# Patient Record
Sex: Male | Born: 1950 | Race: White | Hispanic: No | Marital: Married | Smoking: Never smoker
Health system: Southern US, Community
[De-identification: ages and names within clinical notes are randomized; demographics above are authoritative.]

## PROBLEM LIST (undated history)

## (undated) DIAGNOSIS — Z9889 Other specified postprocedural states: Secondary | ICD-10-CM

## (undated) DIAGNOSIS — M48061 Spinal stenosis, lumbar region without neurogenic claudication: Secondary | ICD-10-CM

## (undated) DIAGNOSIS — Z973 Presence of spectacles and contact lenses: Secondary | ICD-10-CM

## (undated) DIAGNOSIS — K760 Fatty (change of) liver, not elsewhere classified: Secondary | ICD-10-CM

## (undated) DIAGNOSIS — M1990 Unspecified osteoarthritis, unspecified site: Secondary | ICD-10-CM

## (undated) DIAGNOSIS — I10 Essential (primary) hypertension: Secondary | ICD-10-CM

## (undated) DIAGNOSIS — M21371 Foot drop, right foot: Secondary | ICD-10-CM

## (undated) DIAGNOSIS — E1142 Type 2 diabetes mellitus with diabetic polyneuropathy: Secondary | ICD-10-CM

## (undated) DIAGNOSIS — E119 Type 2 diabetes mellitus without complications: Secondary | ICD-10-CM

## (undated) DIAGNOSIS — C801 Malignant (primary) neoplasm, unspecified: Secondary | ICD-10-CM

## (undated) DIAGNOSIS — E785 Hyperlipidemia, unspecified: Secondary | ICD-10-CM

## (undated) DIAGNOSIS — R112 Nausea with vomiting, unspecified: Secondary | ICD-10-CM

## (undated) DIAGNOSIS — C679 Malignant neoplasm of bladder, unspecified: Secondary | ICD-10-CM

## (undated) HISTORY — PX: CARPAL TUNNEL RELEASE: SHX101

## (undated) HISTORY — DX: Other specified postprocedural states: Z98.890

## (undated) HISTORY — PX: KNEE ARTHROSCOPY: SUR90

## (undated) HISTORY — DX: Other specified postprocedural states: R11.2

## (undated) HISTORY — PX: COLONOSCOPY: SHX174

## (undated) HISTORY — PX: BASAL CELL CARCINOMA EXCISION: SHX1214

---

## 1999-11-30 ENCOUNTER — Emergency Department (HOSPITAL_COMMUNITY): Admission: EM | Admit: 1999-11-30 | Discharge: 1999-12-01 | Payer: Self-pay | Admitting: Emergency Medicine

## 1999-12-01 ENCOUNTER — Encounter: Payer: Self-pay | Admitting: Emergency Medicine

## 2002-02-06 HISTORY — PX: CHOLECYSTECTOMY: SHX55

## 2002-09-30 ENCOUNTER — Encounter: Payer: Self-pay | Admitting: General Surgery

## 2002-09-30 ENCOUNTER — Encounter (INDEPENDENT_AMBULATORY_CARE_PROVIDER_SITE_OTHER): Payer: Self-pay

## 2002-09-30 ENCOUNTER — Observation Stay (HOSPITAL_COMMUNITY): Admission: RE | Admit: 2002-09-30 | Discharge: 2002-10-01 | Payer: Self-pay | Admitting: General Surgery

## 2003-11-26 ENCOUNTER — Ambulatory Visit: Payer: Self-pay | Admitting: Internal Medicine

## 2003-12-17 ENCOUNTER — Ambulatory Visit: Payer: Self-pay | Admitting: Gastroenterology

## 2005-02-06 DIAGNOSIS — C801 Malignant (primary) neoplasm, unspecified: Secondary | ICD-10-CM

## 2005-02-06 HISTORY — DX: Malignant (primary) neoplasm, unspecified: C80.1

## 2005-11-02 ENCOUNTER — Ambulatory Visit: Payer: Self-pay | Admitting: Gastroenterology

## 2005-12-21 ENCOUNTER — Ambulatory Visit: Payer: Self-pay | Admitting: Gastroenterology

## 2007-02-07 HISTORY — PX: HERNIA REPAIR: SHX51

## 2007-03-18 ENCOUNTER — Ambulatory Visit: Payer: Self-pay | Admitting: Internal Medicine

## 2007-05-31 ENCOUNTER — Encounter (INDEPENDENT_AMBULATORY_CARE_PROVIDER_SITE_OTHER): Payer: Self-pay | Admitting: General Surgery

## 2007-05-31 ENCOUNTER — Inpatient Hospital Stay (HOSPITAL_COMMUNITY): Admission: RE | Admit: 2007-05-31 | Discharge: 2007-06-01 | Payer: Self-pay | Admitting: General Surgery

## 2008-06-27 ENCOUNTER — Ambulatory Visit: Payer: Self-pay | Admitting: Unknown Physician Specialty

## 2010-02-06 HISTORY — PX: SHOULDER ARTHROSCOPY: SHX128

## 2010-06-21 NOTE — Op Note (Signed)
NAMEWASHINGTON, [name]           ACCOUNT NO.:  [id number]   MEDICAL RECORD NO.:  [id number]          PATIENT TYPE:  AMB   LOCATION:  DAY                          FACILITY:  [facility]   PHYSICIAN:  Adolph Pollack, M.D.DATE OF BIRTH:  [birth year]   DATE OF PROCEDURE:  05/31/2007  DATE OF DISCHARGE:                               OPERATIVE REPORT   PREOPERATIVE DIAGNOSIS:  Incarcerated incisional hernia in umbilical  area.   POSTOPERATIVE DIAGNOSIS:  Incarcerated incisional hernia in umbilical  area.   PROCEDURE:  Repair of incarcerated incisional hernia.   SURGEON:  Adolph Pollack, M.D.   ANESTHESIA:  General.   INDICATIONS:  This is a 60 year old diabetic male who apparently was  doing some heavy lifting at work and developed some acute pain and  bulging in the umbilical area.  He had laparoscopic cholecystectomy and  a previous incision in the subumbilical region.  The pain persisted, and  he noticed a firm knot that was present.  There was also redness that  began to spread.  He was seen in the urgent office by Dr. Luisa Hart and  felt to have an incarcerated/umbilical hernia/incisional hernia and sent  to [facility] for emergency repair.   TECHNIQUE:  He was seen holding area, brought to the operative room,  placed supine on the operating table, and a general anesthetic was  administered.  Hair in the abdominal wall was clipped, and the area  sterilely prepped and draped.  A curvilinear incision was made inferior  to the umbilicus down to the fascia.  I then encircled the umbilicus and  noted a firm indurated mass in the umbilicus.  I dissected the umbilicus  free from the fascia and noted some incarcerated firm indurated and  inflamed fatty tissue that I debrided from the umbilicus.  There was  also inflamed peritoneum which I debrided as well.  Some of this was  sent for pathology.  There was no purulence.   I then dissected subcutaneous tissue around the fascial  defect which  measured about 1-1/2 to 2 cm.  I then primarily closed the defect with  interrupted #1 Novofil sutures.  I did not use mesh because of the  cellulitis and inflammatory reaction.  I then copiously irrigated the  wound.  Marcaine was infiltrated in the subcutaneous tissue and fascia.   The umbilicus was implanted on the fascia with a 3-0 Vicryl suture.  The  subcutaneous tissue was then closed with a running 3-0 Vicryl suture and  skin closed with staples.  Sterile dressing was applied.   He tolerated the procedure without any apparent complications and was  taken to recovery in satisfactory condition.      Adolph Pollack, M.D.  Electronically Signed    TJR/MEDQ  D:  05/31/2007  T:  05/31/2007  Job:  [id number]

## 2010-06-21 NOTE — Consult Note (Signed)
[name], [name]           ACCOUNT NO.:  [id number]   MEDICAL RECORD NO.:  [id number]          PATIENT TYPE:  AMB   LOCATION:  DAY                          FACILITY:  [facility]   PHYSICIAN:  Thomas A. Cornett, M.D.DATE OF BIRTH:  [birth year]   DATE OF CONSULTATION:  05/31/2007  DATE OF DISCHARGE:                                 CONSULTATION   CHIEF COMPLAINT:  Umbilical pain and redness   HISTORY OF PRESENT ILLNESS:  The patient is a 60 year old male with a 3  day history of increasing abdominal pain.  He has had a bulge at his  umbilicus and this was noted after lifting at work on Tuesday.  The  bulge was tender, but now has associated redness and increasing pain  around the umbilicus.  He has no nausea, vomiting, fever or chills.  His  past medical history is significant for type 2 diabetes mellitus and  hypertension.  He was sent at the request of Dr. Alwyn Ren.  He has no  other complaints.   PAST MEDICAL HISTORY:  1. Hypertension.  2. Type 2 diabetes mellitus.   ALLERGIES:  NONE.   MEDICATIONS:  1. Lisinopril 10 mg daily.  2. Pravastatin 80 mg daily.  3. Metformin, dose not given.  4. Aspirin daily.   PAST SURGICAL HISTORY:  Cholecystectomy.   SOCIAL HISTORY:  He denies tobacco or alcohol use.   FAMILY HISTORY:  Positive for Alzheimer's and type 2 diabetes mellitus.   REVIEW OF SYSTEMS:  Negative x15.   PHYSICAL EXAMINATION:  VITAL SIGNS:  Temperature 97.8, heart rate 83,  blood pressure 113/77.  GENERAL APPEARANCE:  Pleasant male in no apparent distress.  HEENT:  Extraocular movements are intact.  No evidence of scleral  icterus.  NECK:  Supple, nontender.  No mass.  CHEST:  Clear to auscultation.  Chest wall motion normal.  CARDIOVASCULAR:  Regular rate and rhythm without rub, murmur or gallop.  ABDOMEN:  Tender around the umbilicus with a mass at the umbilicus that  is  nonreducible.  Significant erythema around this area.  It is warm to  touch.  EXTREMITIES:  No clubbing, cyanosis nor edema.   IMPRESSION:  Incarcerated umbilical hernia with increased cellulitis.   PLAN:  This will need to be addressed tonight.  I have talked with Dr.  Abbey Chatters who is on call at [facility] and he has agreed to see the  patient.  He will probably need repair of this.  The risk of bleeding,  infection, recurrence are all potential possibilities.      Thomas A. Cornett, M.D.  Electronically Signed     TAC/MEDQ  D:  05/31/2007  T:  05/31/2007  Job:  [id number]   cc:   Alwyn Ren, MD

## 2010-06-24 NOTE — Op Note (Signed)
NAME:  [name], [name]                     ACCOUNT NO.:  [id number]   MEDICAL RECORD NO.:  [id number]                   PATIENT TYPE:  AMB   LOCATION:  DAY                                  FACILITY:  [facility]   PHYSICIAN:  Timothy E. Earlene Plater, M.D.              DATE OF BIRTH:  [birth year]   DATE OF PROCEDURE:  09/30/2002  DATE OF DISCHARGE:                                 OPERATIVE REPORT   PREOPERATIVE DIAGNOSIS:  Cholecystolithiasis.   POSTOPERATIVE DIAGNOSIS:  Cholecystolithiasis.   PROCEDURE:  Laparoscopic cholecystectomy.   SURGEON:  Timothy E. Earlene Plater, M.D.   ASSISTANT:  Anselm Pancoast. Zachery Dakins, M.D.   ANESTHESIA:  CRNA.   INDICATIONS FOR PROCEDURE:  Mr. Lowrimore presented to my office within the  last two weeks following his first acute gallbladder attack. Gallstones are  shown and proven, symptoms are compatible and after careful discussion he  wishes to proceed with surgery. His cardiac status is stable and his  laboratory data are normal. He is identified and a permit signed.   He was taken to the operating room, placed supine, general endotracheal  anesthesia administered. The abdomen was shaved, prepped and draped in the  usual fashion. Marcaine 0.25% with epinephrine was used prior to each  incision. A vertical infraumbilical incision made, the fascia identified,  opened vertically, the abdomen entered without complications. A #1 Vicryl  suture placed across that fascial incision and the Hasson catheter passed  between the suture and the Hasson catheter tied in place. The abdomen was  insufflated. Peritoneoscopy carried out and was unremarkable. A second 10 mm  trocar placed in the mid epigastrium, two 5 mm trocars in the right upper  quadrant. The gallbladder was small, 50% intrahepatic. It was grasped,  placed on tension, careful dissection at the base of the gallbladder  revealed a normal albeit small cystic duct. This was dissected completely, a  window created  posterior to that. A clip was placed on the gallbladder side  of the cystic duct and with the first snip of the scissor, the entire cystic  duct was divided. It was tiny, the lumen was less than a millimeter. It was  picked up and two clips were applied to the cystic duct remnant that were  secure. The artery was identified and triply clipped and divided. A small  bleeder was cauterized, the bed was dry. As noted, the gallbladder was  intrahepatic, there was no tissue to dissected between the gallbladder and  the liver bed and the gallbladder was simply gently carefully peeled out of  the liver bed. Several placed were cauterized and the liver bed was dry. We  did place a piece of Surgicel because of the raw surface. Irrigant was  clear. The gallbladder was removed through the infraumbilical incision which  was then tied under direct vision. Copious irrigation carried out and was  clear. All irrigant, CO2, instruments and trocars removed  under direct vision.  Counts were correct. Each wound was inspected, cautery  was used when necessary. The wounds were closed with 3-0 Monocryl. Steri-  Strips applied, final counts correct. He tolerated it well and was awakened  and taken to the recovery room in good condition.                                               Timothy E. Earlene Plater, M.D.    TED/MEDQ  D:  09/30/2002  T:  09/30/2002  Job:  [id number]

## 2010-11-01 LAB — CBC
HCT: 43.6
Hemoglobin: 14.9
MCHC: 34.1
MCV: 86.3
Platelets: 194
RBC: 5.05
RDW: 12.4
WBC: 6.8

## 2010-11-01 LAB — COMPREHENSIVE METABOLIC PANEL
ALT: 32
AST: 25
Albumin: 4
Alkaline Phosphatase: 66
BUN: 12
CO2: 27
Calcium: 9.2
Chloride: 104
Creatinine, Ser: 0.86
GFR calc Af Amer: >60
GFR calc non Af Amer: >60
Glucose, Bld: 125 — ABNORMAL HIGH
Potassium: 3.8
Sodium: 138
Total Bilirubin: 0.8
Total Protein: 6.8

## 2013-07-20 DIAGNOSIS — E119 Type 2 diabetes mellitus without complications: Secondary | ICD-10-CM | POA: Insufficient documentation

## 2013-07-20 DIAGNOSIS — E785 Hyperlipidemia, unspecified: Secondary | ICD-10-CM | POA: Insufficient documentation

## 2013-07-20 DIAGNOSIS — I10 Essential (primary) hypertension: Secondary | ICD-10-CM | POA: Insufficient documentation

## 2013-07-20 DIAGNOSIS — K760 Fatty (change of) liver, not elsewhere classified: Secondary | ICD-10-CM | POA: Insufficient documentation

## 2013-11-18 ENCOUNTER — Encounter (HOSPITAL_BASED_OUTPATIENT_CLINIC_OR_DEPARTMENT_OTHER)
Admission: RE | Admit: 2013-11-18 | Discharge: 2013-11-18 | Disposition: A | Discharge status: Home / Self Care | Payer: BC Managed Care – PPO | Source: Ambulatory Visit | Attending: Orthopedic Surgery | Admitting: Orthopedic Surgery

## 2013-11-18 ENCOUNTER — Encounter (HOSPITAL_BASED_OUTPATIENT_CLINIC_OR_DEPARTMENT_OTHER): Payer: Self-pay | Admitting: *Deleted

## 2013-11-18 DIAGNOSIS — Y929 Unspecified place or not applicable: Secondary | ICD-10-CM | POA: Diagnosis not present

## 2013-11-18 DIAGNOSIS — X58XXXA Exposure to other specified factors, initial encounter: Secondary | ICD-10-CM | POA: Diagnosis not present

## 2013-11-18 DIAGNOSIS — Z79899 Other long term (current) drug therapy: Secondary | ICD-10-CM | POA: Diagnosis not present

## 2013-11-18 DIAGNOSIS — S86211A Strain of muscle(s) and tendon(s) of anterior muscle group at lower leg level, right leg, initial encounter: Secondary | ICD-10-CM | POA: Diagnosis present

## 2013-11-18 DIAGNOSIS — I10 Essential (primary) hypertension: Secondary | ICD-10-CM | POA: Diagnosis not present

## 2013-11-18 DIAGNOSIS — Z7982 Long term (current) use of aspirin: Secondary | ICD-10-CM | POA: Diagnosis not present

## 2013-11-18 DIAGNOSIS — E1142 Type 2 diabetes mellitus with diabetic polyneuropathy: Secondary | ICD-10-CM | POA: Diagnosis not present

## 2013-11-18 DIAGNOSIS — M62471 Contracture of muscle, right ankle and foot: Secondary | ICD-10-CM | POA: Diagnosis not present

## 2013-11-18 LAB — BASIC METABOLIC PANEL
Anion gap: 12 (ref 5–15)
BUN: 20 mg/dL (ref 6–23)
CALCIUM: 9.5 mg/dL (ref 8.4–10.5)
CO2: 25 meq/L (ref 19–32)
CREATININE: 0.9 mg/dL (ref 0.50–1.35)
Chloride: 103 mEq/L (ref 96–112)
GFR calc Af Amer: >90 mL/min (ref >90)
GFR, EST NON AFRICAN AMERICAN: 89 mL/min — AB (ref >90)
GLUCOSE: 141 mg/dL — AB (ref 70–99)
Potassium: 4.2 mEq/L (ref 3.7–5.3)
Sodium: 140 mEq/L (ref 137–147)

## 2013-11-18 NOTE — Progress Notes (Signed)
11/18/13 1009  [place]  Have you ever been diagnosed with sleep apnea through a sleep study? No  Do you snore loudly (loud enough to be heard through closed doors)?  1  Do you often feel tired, fatigued, or sleepy during the daytime? 0  Has anyone observed you stop breathing during your sleep? 0  Do you have, or are you being treated for high blood pressure? 1  BMI more than 35 kg/m2? 0  Age over 63 years old? 1  Neck circumference greater than 40 cm/16 inches? 0  Gender: 1  Obstructive Sleep Apnea Score 4  Score 4 or greater  Results sent to PCP

## 2013-11-18 NOTE — Progress Notes (Signed)
To come for [id number]

## 2013-11-19 ENCOUNTER — Other Ambulatory Visit: Payer: Self-pay | Admitting: Physician Assistant

## 2013-11-20 ENCOUNTER — Encounter (HOSPITAL_BASED_OUTPATIENT_CLINIC_OR_DEPARTMENT_OTHER)
Admission: RE | Disposition: A | Discharge status: Home / Self Care | Payer: Self-pay | Source: Ambulatory Visit | Attending: Orthopedic Surgery

## 2013-11-20 ENCOUNTER — Ambulatory Visit (HOSPITAL_BASED_OUTPATIENT_CLINIC_OR_DEPARTMENT_OTHER): Payer: BC Managed Care – PPO | Admitting: Certified Registered"

## 2013-11-20 ENCOUNTER — Ambulatory Visit (HOSPITAL_BASED_OUTPATIENT_CLINIC_OR_DEPARTMENT_OTHER)
Admission: RE | Admit: 2013-11-20 | Discharge: 2013-11-20 | Disposition: A | Discharge status: Home / Self Care | Payer: BC Managed Care – PPO | Source: Ambulatory Visit | Attending: Orthopedic Surgery | Admitting: Orthopedic Surgery

## 2013-11-20 ENCOUNTER — Encounter (HOSPITAL_BASED_OUTPATIENT_CLINIC_OR_DEPARTMENT_OTHER): Payer: BC Managed Care – PPO | Admitting: Certified Registered"

## 2013-11-20 ENCOUNTER — Encounter (HOSPITAL_BASED_OUTPATIENT_CLINIC_OR_DEPARTMENT_OTHER): Payer: Self-pay | Admitting: *Deleted

## 2013-11-20 DIAGNOSIS — Z7982 Long term (current) use of aspirin: Secondary | ICD-10-CM | POA: Insufficient documentation

## 2013-11-20 DIAGNOSIS — S86211A Strain of muscle(s) and tendon(s) of anterior muscle group at lower leg level, right leg, initial encounter: Secondary | ICD-10-CM | POA: Insufficient documentation

## 2013-11-20 DIAGNOSIS — X58XXXA Exposure to other specified factors, initial encounter: Secondary | ICD-10-CM | POA: Insufficient documentation

## 2013-11-20 DIAGNOSIS — Z79899 Other long term (current) drug therapy: Secondary | ICD-10-CM | POA: Insufficient documentation

## 2013-11-20 DIAGNOSIS — I10 Essential (primary) hypertension: Secondary | ICD-10-CM | POA: Insufficient documentation

## 2013-11-20 DIAGNOSIS — E1142 Type 2 diabetes mellitus with diabetic polyneuropathy: Secondary | ICD-10-CM | POA: Insufficient documentation

## 2013-11-20 DIAGNOSIS — Y929 Unspecified place or not applicable: Secondary | ICD-10-CM | POA: Insufficient documentation

## 2013-11-20 DIAGNOSIS — S86811D Strain of other muscle(s) and tendon(s) at lower leg level, right leg, subsequent encounter: Secondary | ICD-10-CM

## 2013-11-20 DIAGNOSIS — M62471 Contracture of muscle, right ankle and foot: Secondary | ICD-10-CM | POA: Insufficient documentation

## 2013-11-20 HISTORY — DX: Type 2 diabetes mellitus without complications: E11.9

## 2013-11-20 HISTORY — DX: Unspecified osteoarthritis, unspecified site: M19.90

## 2013-11-20 HISTORY — DX: Presence of spectacles and contact lenses: Z97.3

## 2013-11-20 HISTORY — PX: GASTROC RECESSION EXTREMITY: SHX6262

## 2013-11-20 HISTORY — DX: Essential (primary) hypertension: I10

## 2013-11-20 HISTORY — DX: Type 2 diabetes mellitus with diabetic polyneuropathy: E11.42

## 2013-11-20 HISTORY — PX: TENDON REPAIR: SHX5111

## 2013-11-20 LAB — POCT HEMOGLOBIN-HEMACUE: Hemoglobin: 14.8 g/dL (ref 13.0–17.0)

## 2013-11-20 LAB — GLUCOSE, CAPILLARY
Glucose-Capillary: 138 mg/dL — ABNORMAL HIGH (ref 70–99)
Glucose-Capillary: 148 mg/dL — ABNORMAL HIGH (ref 70–99)

## 2013-11-20 SURGERY — TENDON REPAIR
Anesthesia: General | Site: Leg Lower | Laterality: Right

## 2013-11-20 MED ORDER — BUPIVACAINE-EPINEPHRINE (PF) 0.25% -1:200000 IJ SOLN
INTRAMUSCULAR | Status: DC | PRN
  Administered 2013-11-20: 10 mL

## 2013-11-20 MED ORDER — FENTANYL CITRATE 0.05 MG/ML IJ SOLN
50.0000 ug | INTRAMUSCULAR | Status: DC | PRN
  Administered 2013-11-20: 50 ug via INTRAVENOUS

## 2013-11-20 MED ORDER — BUPIVACAINE HCL (PF) 0.25 % IJ SOLN
INTRAMUSCULAR | Status: AC
  Filled 2013-11-20: qty 30

## 2013-11-20 MED ORDER — MIDAZOLAM HCL 5 MG/5ML IJ SOLN
INTRAMUSCULAR | Status: DC | PRN
  Administered 2013-11-20: 1 mg via INTRAVENOUS

## 2013-11-20 MED ORDER — MIDAZOLAM HCL 2 MG/2ML IJ SOLN
INTRAMUSCULAR | Status: AC
  Filled 2013-11-20: qty 2

## 2013-11-20 MED ORDER — CHLORHEXIDINE GLUCONATE 4 % EX LIQD: 60.0000 mL | Freq: Once | CUTANEOUS | Status: DC

## 2013-11-20 MED ORDER — HYDROMORPHONE HCL 1 MG/ML IJ SOLN
INTRAMUSCULAR | Status: AC
Start: 2013-11-20 — End: 2013-11-20
  Filled 2013-11-20: qty 1

## 2013-11-20 MED ORDER — OXYCODONE HCL 5 MG PO TABS
5.0000 mg | ORAL_TABLET | Freq: Once | ORAL | Status: AC | PRN
  Administered 2013-11-20: 5 mg via ORAL

## 2013-11-20 MED ORDER — ASPIRIN EC 325 MG PO TBEC: 325.0000 mg | DELAYED_RELEASE_TABLET | Freq: Every day | ORAL | Status: DC

## 2013-11-20 MED ORDER — OXYCODONE HCL 5 MG/5ML PO SOLN: 5.0000 mg | Freq: Once | ORAL | Status: AC | PRN

## 2013-11-20 MED ORDER — BACITRACIN ZINC 500 UNIT/GM EX OINT
TOPICAL_OINTMENT | CUTANEOUS | Status: DC | PRN
  Administered 2013-11-20: 1 via TOPICAL

## 2013-11-20 MED ORDER — BACITRACIN ZINC 500 UNIT/GM EX OINT
TOPICAL_OINTMENT | CUTANEOUS | Status: AC
  Filled 2013-11-20: qty 28.35

## 2013-11-20 MED ORDER — PROMETHAZINE HCL 25 MG/ML IJ SOLN: 6.2500 mg | INTRAMUSCULAR | Status: DC | PRN

## 2013-11-20 MED ORDER — LACTATED RINGERS IV SOLN
INTRAVENOUS | Status: DC
  Administered 2013-11-20 (×2): via INTRAVENOUS

## 2013-11-20 MED ORDER — CEFAZOLIN SODIUM-DEXTROSE 2-3 GM-% IV SOLR
2.0000 g | INTRAVENOUS | Status: AC
  Administered 2013-11-20: 2 g via INTRAVENOUS

## 2013-11-20 MED ORDER — FENTANYL CITRATE 0.05 MG/ML IJ SOLN
INTRAMUSCULAR | Status: AC
  Filled 2013-11-20: qty 6

## 2013-11-20 MED ORDER — FENTANYL CITRATE 0.05 MG/ML IJ SOLN
INTRAMUSCULAR | Status: AC
  Filled 2013-11-20: qty 2

## 2013-11-20 MED ORDER — BUPIVACAINE-EPINEPHRINE (PF) 0.25% -1:200000 IJ SOLN
INTRAMUSCULAR | Status: AC
  Filled 2013-11-20: qty 30

## 2013-11-20 MED ORDER — SODIUM CHLORIDE 0.9 % IV SOLN: INTRAVENOUS | Status: DC

## 2013-11-20 MED ORDER — OXYCODONE HCL 5 MG PO TABS
ORAL_TABLET | ORAL | Status: AC
  Filled 2013-11-20: qty 1

## 2013-11-20 MED ORDER — 0.9 % SODIUM CHLORIDE (POUR BTL) OPTIME
TOPICAL | Status: DC | PRN
  Administered 2013-11-20: 1000 mL

## 2013-11-20 MED ORDER — LIDOCAINE HCL (CARDIAC) 20 MG/ML IV SOLN
INTRAVENOUS | Status: DC | PRN
  Administered 2013-11-20: 30 mg via INTRAVENOUS

## 2013-11-20 MED ORDER — ACETAMINOPHEN 500 MG PO TABS
ORAL_TABLET | ORAL | Status: AC
Start: 2013-11-20 — End: 2013-11-20
  Filled 2013-11-20: qty 2

## 2013-11-20 MED ORDER — HYDROMORPHONE HCL 1 MG/ML IJ SOLN
0.2500 mg | INTRAMUSCULAR | Status: DC | PRN
  Administered 2013-11-20 (×2): 0.5 mg via INTRAVENOUS

## 2013-11-20 MED ORDER — ACETAMINOPHEN 500 MG PO TABS
1000.0000 mg | ORAL_TABLET | Freq: Once | ORAL | Status: AC
  Administered 2013-11-20: 1000 mg via ORAL

## 2013-11-20 MED ORDER — DEXAMETHASONE SODIUM PHOSPHATE 10 MG/ML IJ SOLN
INTRAMUSCULAR | Status: DC | PRN
  Administered 2013-11-20: 4 mg via INTRAVENOUS

## 2013-11-20 MED ORDER — OXYCODONE HCL 5 MG PO TABS: 5.0000 mg | ORAL_TABLET | ORAL | Status: DC | PRN

## 2013-11-20 MED ORDER — CEFAZOLIN SODIUM-DEXTROSE 2-3 GM-% IV SOLR
INTRAVENOUS | Status: AC
  Filled 2013-11-20: qty 50

## 2013-11-20 MED ORDER — ROPIVACAINE HCL 5 MG/ML IJ SOLN
INTRAMUSCULAR | Status: DC | PRN
  Administered 2013-11-20: 30 mL via PERINEURAL

## 2013-11-20 MED ORDER — MIDAZOLAM HCL 2 MG/2ML IJ SOLN
1.0000 mg | INTRAMUSCULAR | Status: DC | PRN
  Administered 2013-11-20: 1 mg via INTRAVENOUS

## 2013-11-20 MED ORDER — PROPOFOL 10 MG/ML IV BOLUS
INTRAVENOUS | Status: DC | PRN
  Administered 2013-11-20: 100 mg via INTRAVENOUS
  Administered 2013-11-20: 50 mg via INTRAVENOUS

## 2013-11-20 SURGICAL SUPPLY — 78 items
ANCHOR SCREW TI W/SUT 3 (Screw) ×3 IMPLANT
BANDAGE ESMARK 6X9 LF (GAUZE/BANDAGES/DRESSINGS) ×2 IMPLANT
BLADE AVERAGE 25X9 (BLADE) IMPLANT
BLADE SURG 15 STRL LF DISP TIS (BLADE) ×4 IMPLANT
BLADE SURG 15 STRL SS (BLADE) ×2
BNDG COHESIVE 4X5 TAN STRL (GAUZE/BANDAGES/DRESSINGS) ×3 IMPLANT
BNDG COHESIVE 6X5 TAN STRL LF (GAUZE/BANDAGES/DRESSINGS) ×3 IMPLANT
BNDG ESMARK 4X9 LF (GAUZE/BANDAGES/DRESSINGS) IMPLANT
BNDG ESMARK 6X9 LF (GAUZE/BANDAGES/DRESSINGS) ×3
CHLORAPREP W/TINT 26ML (MISCELLANEOUS) ×3 IMPLANT
COVER BACK TABLE 60X90IN (DRAPES) ×3 IMPLANT
CUFF TOURNIQUET SINGLE 34IN LL (TOURNIQUET CUFF) ×3 IMPLANT
DECANTER SPIKE VIAL GLASS SM (MISCELLANEOUS) IMPLANT
DRAPE C-ARM 42X72 X-RAY (DRAPES) IMPLANT
DRAPE C-ARMOR (DRAPES) IMPLANT
DRAPE EXTREMITY TIBURON (DRAPES) ×3 IMPLANT
DRAPE OEC MINIVIEW 54X84 (DRAPES) ×3 IMPLANT
DRAPE U-SHAPE 47X51 STRL (DRAPES) ×3 IMPLANT
DRSG EMULSION OIL 3X3 NADH (GAUZE/BANDAGES/DRESSINGS) ×3 IMPLANT
DRSG PAD ABDOMINAL 8X10 ST (GAUZE/BANDAGES/DRESSINGS) ×6 IMPLANT
ELECT REM PT RETURN 9FT ADLT (ELECTROSURGICAL) ×3
ELECTRODE REM PT RTRN 9FT ADLT (ELECTROSURGICAL) ×2 IMPLANT
GAUZE SPONGE 4X4 12PLY STRL (GAUZE/BANDAGES/DRESSINGS) ×3 IMPLANT
GLOVE BIO SURGEON STRL SZ7 (GLOVE) IMPLANT
GLOVE BIO SURGEON STRL SZ8 (GLOVE) ×3 IMPLANT
GLOVE BIOGEL M STRL SZ7.5 (GLOVE) ×3 IMPLANT
GLOVE BIOGEL PI IND STRL 7.0 (GLOVE) IMPLANT
GLOVE BIOGEL PI IND STRL 8 (GLOVE) ×4 IMPLANT
GLOVE BIOGEL PI INDICATOR 7.0 (GLOVE)
GLOVE BIOGEL PI INDICATOR 8 (GLOVE) ×2
GLOVE EXAM NITRILE MD LF STRL (GLOVE) ×3 IMPLANT
GOWN STRL REUS W/ TWL LRG LVL3 (GOWN DISPOSABLE) ×4 IMPLANT
GOWN STRL REUS W/ TWL XL LVL3 (GOWN DISPOSABLE) ×4 IMPLANT
GOWN STRL REUS W/TWL LRG LVL3 (GOWN DISPOSABLE) ×2
GOWN STRL REUS W/TWL XL LVL3 (GOWN DISPOSABLE) ×2
K-WIRE .062X4 (WIRE) ×3 IMPLANT
KIT BIO-TENODESIS 3X8 DISP (MISCELLANEOUS)
KIT INSRT BABSR STRL DISP BTN (MISCELLANEOUS) IMPLANT
NDL SUT 6 .5 CRC .975X.05 MAYO (NEEDLE) IMPLANT
NEEDLE HYPO 22GX1.5 SAFETY (NEEDLE) IMPLANT
NEEDLE MAYO TAPER (NEEDLE)
NS IRRIG 1000ML POUR BTL (IV SOLUTION) ×3 IMPLANT
PACK BASIN DAY SURGERY FS (CUSTOM PROCEDURE TRAY) ×3 IMPLANT
PAD CAST 4YDX4 CTTN HI CHSV (CAST SUPPLIES) ×2 IMPLANT
PADDING CAST ABS 4INX4YD NS (CAST SUPPLIES)
PADDING CAST ABS COTTON 4X4 ST (CAST SUPPLIES) IMPLANT
PADDING CAST COTTON 4X4 STRL (CAST SUPPLIES) ×1
PADDING CAST COTTON 6X4 STRL (CAST SUPPLIES) ×3 IMPLANT
PASSER SUT SWANSON 36MM LOOP (INSTRUMENTS) IMPLANT
PENCIL BUTTON HOLSTER BLD 10FT (ELECTRODE) ×3 IMPLANT
SANITIZER HAND PURELL 535ML FO (MISCELLANEOUS) ×3 IMPLANT
SCOTCHCAST PLUS 4X4 WHITE (CAST SUPPLIES) IMPLANT
SHEET MEDIUM DRAPE 40X70 STRL (DRAPES) ×3 IMPLANT
SLEEVE SCD COMPRESS KNEE MED (MISCELLANEOUS) ×3 IMPLANT
SPLINT FAST PLASTER 5X30 (CAST SUPPLIES) ×20
SPLINT PLASTER CAST FAST 5X30 (CAST SUPPLIES) ×40 IMPLANT
SPONGE LAP 18X18 X RAY DECT (DISPOSABLE) ×3 IMPLANT
STOCKINETTE 6  STRL (DRAPES) ×1
STOCKINETTE 6 STRL (DRAPES) ×2 IMPLANT
SUCTION FRAZIER TIP 10 FR DISP (SUCTIONS) ×3 IMPLANT
SUT ETHIBOND 0 MO6 C/R (SUTURE) IMPLANT
SUT ETHIBOND 2 OS 4 DA (SUTURE) IMPLANT
SUT ETHILON 3 0 PS 1 (SUTURE) ×3 IMPLANT
SUT FIBERWIRE 2-0 18 17.9 3/8 (SUTURE)
SUT MERSILENE 2.0 SH NDLE (SUTURE) IMPLANT
SUT MNCRL AB 3-0 PS2 18 (SUTURE) ×6 IMPLANT
SUT MNCRL AB 4-0 PS2 18 (SUTURE) IMPLANT
SUT VIC AB 0 SH 27 (SUTURE) ×18 IMPLANT
SUT VIC AB 2-0 SH 18 (SUTURE) IMPLANT
SUT VIC AB 2-0 SH 27 (SUTURE)
SUT VIC AB 2-0 SH 27XBRD (SUTURE) IMPLANT
SUT VICRYL 4-0 PS2 18IN ABS (SUTURE) IMPLANT
SUTURE FIBERWR 2-0 18 17.9 3/8 (SUTURE) IMPLANT
SYR BULB 3OZ (MISCELLANEOUS) ×3 IMPLANT
TOWEL OR 17X24 6PK STRL BLUE (TOWEL DISPOSABLE) ×6 IMPLANT
TOWEL OR NON WOVEN STRL DISP B (DISPOSABLE) ×3 IMPLANT
TUBE CONNECTING 20X1/4 (TUBING) IMPLANT
UNDERPAD 30X30 INCONTINENT (UNDERPADS AND DIAPERS) ×3 IMPLANT

## 2013-11-20 NOTE — Progress Notes (Signed)
Assisted Dr. Rob Fitzgerald with right, ultrasound guided, popliteal block. Side rails up, monitors on throughout procedure. See vital signs in flow sheet. Tolerated Procedure well. 

## 2013-11-20 NOTE — Discharge Instructions (Signed)
Joseph Simmer, MD [place]  Please read the following information regarding your care after surgery.  Medications  You only need a prescription for the narcotic pain medicine (ex. oxycodone, Percocet, Norco).  All of the other medicines listed below are available over the counter. X acetominophen (Tylenol) 650 mg every 4-6 hours as you need for minor pain X oxycodone as prescribed for moderate to severe pain   Narcotic pain medicine (ex. oxycodone, Percocet, Vicodin) will cause constipation.  To prevent this problem, take the following medicines while you are taking any pain medicine. X docusate sodium (Colace) 100 mg twice a day X senna (Senokot) 2 tablets twice a day  X To help prevent blood clots, take an aspirin (325 mg) once a day for a month after surgery.  You should also get up every hour while you are awake to move around.    Weight Bearing ? Bear weight when you are able on your operated leg or foot. ? Bear weight only on the heel of your operated foot in the post-op shoe. X Do not bear any weight on the operated leg or foot.  Cast / Splint / Dressing X Keep your splint or cast clean and dry.  Dont put anything (coat hanger, pencil, etc) down inside of it.  If it gets damp, use a hair dryer on the cool setting to dry it.  If it gets soaked, call the office to schedule an appointment for a cast change. ? Remove your dressing 3 days after surgery and cover the incisions with dry dressings.    After your dressing, cast or splint is removed; you may shower, but do not soak or scrub the wound.  Allow the water to run over it, and then gently pat it dry.  Swelling It is normal for you to have swelling where you had surgery.  To reduce swelling and pain, keep your toes above your nose for at least 3 days after surgery.  It may be necessary to keep your foot or leg elevated for several weeks.  If it hurts, it should be elevated.  Follow Up Call my office at [phone]  when you are discharged from the hospital or surgery center to schedule an appointment to be seen two weeks after surgery.  Call my office at [phone] if you develop a fever >101.5 F, nausea, vomiting, bleeding from the surgical site or severe pain.     Post Anesthesia Home Care Instructions  Activity: Get plenty of rest for the remainder of the day. A responsible adult should stay with you for 24 hours following the procedure.  For the next 24 hours, DO NOT: -Drive a car -Paediatric nurse -Drink alcoholic beverages -Take any medication unless instructed by your physician -Make any legal decisions or sign important papers.  Meals: Start with liquid foods such as gelatin or soup. Progress to regular foods as tolerated. Avoid greasy, spicy, heavy foods. If nausea and/or vomiting occur, drink only clear liquids until the nausea and/or vomiting subsides. Call your physician if vomiting continues.  Special Instructions/Symptoms: Your throat may feel dry or sore from the anesthesia or the breathing tube placed in your throat during surgery. If this causes discomfort, gargle with warm salt water. The discomfort should disappear within 24 hours.

## 2013-11-20 NOTE — Transfer of Care (Deleted)
Immediate Anesthesia Transfer of Care Note  Patient: [name]  Procedure(s) Performed: Procedure(s): RIGHT TIBIALIS ANTERIOR RECONSTRUCTION GASTROC RESESSION POSSIBLE JONES PROCEDURE POSSIBLE EXTENSOR HALLUCIS LONGUS TRANSFER  (Right)  Patient Location: PACU  Anesthesia Type:GA combined with regional for post-op pain  Level of Consciousness: awake and patient cooperative  Airway & Oxygen Therapy: Patient Spontanous Breathing and Patient connected to face mask oxygen  Post-op Assessment: Report given to PACU RN and Post -op Vital signs reviewed and stable  Post vital signs: Reviewed and stable  Complications: No apparent anesthesia complications

## 2013-11-20 NOTE — Anesthesia Preprocedure Evaluation (Addendum)
Anesthesia Evaluation  Patient identified by MRN, date of birth, ID band Patient awake    Reviewed: Allergy & Precautions, NPO status , Patient's Chart, lab work & pertinent test results  Airway Mallampati: II TM Distance: >3 FB Neck ROM: Full    Dental  (+) Dental Advisory Given   Pulmonary neg pulmonary ROS,  breath sounds clear to auscultation        Cardiovascular hypertension, Pt. on medications Rhythm:Regular Rate:Normal     Neuro/Psych  Neuromuscular disease negative psych ROS   GI/Hepatic negative GI ROS, Neg liver ROS,   Endo/Other  diabetes, Type 2, Oral Hypoglycemic Agents  Renal/GU negative Renal ROS     Musculoskeletal  (+) Arthritis -,   Abdominal   Peds  Hematology negative hematology ROS (+)   Anesthesia Other Findings   Reproductive/Obstetrics                          Anesthesia Physical Anesthesia Plan  ASA: II  Anesthesia Plan: General   Post-op Pain Management:    Induction: Intravenous  Airway Management Planned: LMA and Oral ETT  Additional Equipment:   Intra-op Plan:   Post-operative Plan: Extubation in OR  Informed Consent: I have reviewed the patients History and Physical, chart, labs and discussed the procedure including the risks, benefits and alternatives for the proposed anesthesia with the patient or authorized representative who has indicated his/her understanding and acceptance.   Dental advisory given  Plan Discussed with: CRNA and Surgeon  Anesthesia Plan Comments:         Anesthesia Quick Evaluation

## 2013-11-20 NOTE — Op Note (Signed)
[name], [name]           ACCOUNT NO.:  [id number]  MEDICAL RECORD NO.:  [id number]  LOCATION:                                 FACILITY:  PHYSICIAN:  Wylene Simmer, MD             DATE OF BIRTH:  DATE OF PROCEDURE:  11/20/2013 DATE OF DISCHARGE:                              OPERATIVE REPORT   PREOPERATIVE DIAGNOSES: 1. Right tibialis anterior tendon rupture. 2. Tight right heel cord.  POSTOPERATIVE DIAGNOSES: 1. Right tibialis anterior tendon rupture. 2. Tight right heel cord.  PROCEDURE: 1. Right gastrocnemius recession. 2. Reconstruction of the right tibialis anterior tendon with autograft     plantaris tendon. 3. AP and lateral radiographs of the right foot.  SURGEON:  Wylene Simmer, MD  ANESTHESIA:  General, regional.  ESTIMATED BLOOD LOSS:  Minimal.  TOURNIQUET TIME:  79 minutes, 250 mmHg.  COMPLICATIONS:  None apparent.  DISPOSITION:  Extubated, awake, and stable to recovery.  INDICATIONS FOR PROCEDURE:  The patient is a 63 year old male with past medical history significant for diabetes.  He has felt some pain anteriorly in his ankle for the last couple of months.  Recently, he noticed a knot on the front of the ankle and started having difficulty with his gait.  He was seen in the office and an MRI was ordered.  This showed a rupture of his tibialis anterior tendon.  He presents now for operative treatment of this painful condition.  He understands the risks and benefits of the alternative treatment options, and elects surgical treatment.  He specifically understands risks of bleeding, infection, nerve damage, blood clots, need for additional surgery, continued pain, amputation, and death.  PROCEDURE IN DETAIL:  After preoperative consent was obtained and the correct operative site was identified, the patient was brought to the operating room and placed supine on the operating table.  General anesthesia was induced.  Preoperative antibiotics were  administered. Surgical time-out was taken.  Right lower extremity was prepped and draped in standard sterile fashion with tourniquet around the thigh. The extremity was exsanguinated.  The tourniquet was inflated to 250 mmHg.  A longitudinal incision was made at the medial calf.  Sharp dissection was carried down through the skin and subcutaneous tissue. The superficial fascia was incised.  The plantaris tendon was identified.  A tendon stripper was then used to pass along the tendon and released it proximally and then this was repeated distally.  The tendon graft was then passed to the back table where it was cleared of all muscle and maintained in a moistened sponge.  Sural nerve was protected and the tendon was transected from medial to lateral under direct vision.  The ankle was then noted to dorsiflex 15 degrees with the knee extended.  The wound was irrigated.  The incision was closed with Monocryl and nylon.  Attention was then turned to the ankle where a longitudinal incision was made over the tibialis anterior tendon.  Extensor retinaculum was incised.  The tendon was noted to be ruptured from its insertion.  There was hematoma present.  This was all irrigated.  The tendon end was freshened with scissors and cleaned of all fibrinous  material.  A tagging suture was placed in the tendon end.  It was noted to have good excursion at the muscle belly.  The tendon insertion was inspected.  The tendon was noted to be ruptured distally with intact tibialis anterior tendon over about 2 cm area at the medial cuneiform.  The medial cuneiform was identified on AP and lateral radiographs with a K-wire. The K-wire was removed and a 3 mm Biomet corkscrew anchor was inserted. The periosteum was then debrided from the bone adjacent to the insertion point of the anchor.  The suture from the anchor was advanced into the tendon using a whipstitch retrograde and then antegrade.  Tendon was pulled  down to the bone and the suture tied securely.  The remaining portion of the tendon at the medial cuneiform was then sewn to the tendon at the site of the rupture with figure-of-eight sutures of 0 Vicryl.  The plantaris tendon was then woven through the tibialis anterior tendon and wrapped around the tendon and then through the remaining tendon stump and periosteum distally.  It was sewn in place using 0 Vicryl simple sutures.  The wound was then irrigated copiously. The extensor retinaculum was repaired over the tendon with simple sutures of 0 Vicryl.  Subcutaneous tissue was approximated with 3-0 Monocryl and the skin was closed with a running nylon.  Sterile dressings were applied followed by a well-padded short-leg cast.  The tourniquet was released at 79 minutes after application of the cast. The patient was awakened from anesthesia and transported to the recovery room in stable condition.  FOLLOWUP PLAN:  The patient will be nonweightbearing on the right lower extremity.  He will follow up with me in 2 weeks for conversion to a walking cast.  He will start aspirin for DVT prophylaxis.  RADIOGRAPHS:  AP and lateral radiographs of the right foot were obtained intraoperatively.  These show interval placement of the  metallic suture anchor within the medial cuneiform.  No acute injuries noted.     Wylene Simmer, MD     JH/MEDQ  D:  11/20/2013  T:  11/20/2013  Job:  [id number]

## 2013-11-20 NOTE — H&P (Signed)
[name] is an 63 y.o. male.   Chief Complaint:  Right ankle weakness HPI:  63 y/o male with PMH of diabetes presents now with a right tibialis anterior tendon rupture as well as a tight heelcord.  He presents for operative treatment.    Past Medical History  Diagnosis Date  . Hypertension   . Diabetes mellitus without complication   . Wears glasses   . Diabetic peripheral neuropathy   . Arthritis     Past Surgical History  Procedure Laterality Date  . Colonoscopy    . Cholecystectomy  2004    lap choli  . Shoulder arthroscopy  2012  . Hernia repair  2009    umb    History reviewed. No pertinent family history. Social History:  reports that he has never smoked. He does not have any smokeless tobacco history on file. He reports that he does not drink alcohol or use illicit drugs.  Allergies: No Known Allergies  Medications Prior to Admission  Medication Sig Dispense Refill  . aspirin 81 MG tablet Take 81 mg by mouth daily.      Marland Kitchen glimepiride (AMARYL) 1 MG tablet Take 1 mg by mouth daily with breakfast.      . lisinopril (PRINIVIL,ZESTRIL) 10 MG tablet Take 10 mg by mouth daily.      . meloxicam (MOBIC) 15 MG tablet Take 15 mg by mouth daily.      . metFORMIN (GLUCOPHAGE) 500 MG tablet Take 500 mg by mouth every morning. Takes 3 at same time      . Multiple Vitamins-Minerals (MULTIVITAMIN WITH MINERALS) tablet Take 1 tablet by mouth daily.      . Omega-3 Fatty Acids (FISH OIL) 1000 MG CAPS Take by mouth.      . pravastatin (PRAVACHOL) 80 MG tablet Take 80 mg by mouth daily.        Results for orders placed during the hospital encounter of 11/20/13 (from the past 48 hour(s))  BASIC METABOLIC PANEL     Status: Abnormal   Collection Time    11/18/13  3:30 PM      Result Value Ref Range   Sodium 140  137 - 147 mEq/L   Potassium 4.2  3.7 - 5.3 mEq/L   Chloride 103  96 - 112 mEq/L   CO2 25  19 - 32 mEq/L   Glucose, Bld 141 (*) 70 - 99 mg/dL   BUN 20  6 - 23 mg/dL    Creatinine, Ser 0.90  0.50 - 1.35 mg/dL   Calcium 9.5  8.4 - 10.5 mg/dL   GFR calc non Af Amer 89 (*) >90 mL/min   GFR calc Af Amer >90  >90 mL/min   Comment: (NOTE)     The eGFR has been calculated using the CKD EPI equation.     This calculation has not been validated in all clinical situations.     eGFR's persistently <90 mL/min signify possible Chronic Kidney     Disease.   Anion gap 12  5 - 15  GLUCOSE, CAPILLARY     Status: Abnormal   Collection Time    11/20/13  7:59 AM      Result Value Ref Range   Glucose-Capillary 138 (*) 70 - 99 mg/dL  POCT HEMOGLOBIN-HEMACUE     Status: None   Collection Time    11/20/13  8:01 AM      Result Value Ref Range   Hemoglobin 14.8  13.0 - 17.0 g/dL  No results found.  ROS  No recent f/c/n/v/wt loss  Blood pressure 132/81, pulse 77, temperature 97.8 F (36.6 C), temperature source Oral, resp. rate 17, height _0  (1.778 m), weight 85.73 kg (189 lb), SpO2 100.00%. Physical Exam  wn wd male in nad.  A and O x 4.  Mood and affect normal.  EOMI.  resp unlabored.  R ankle with weakness in DF.  5/5 strength in PF.  Tight heelcord.  No lymphadenopathy.  Palpable pulses.  Feels LT normally at the ankle but diminished sens to LT at the forefoot.  Assessment/Plan R tibialis anterior tendon rupture and tight heelcord - to OR for gastroc recession and tibialis anterior tendon reconstruction.  The risks and benefits of the alternative treatment options have been discussed in detail.  The patient wishes to proceed with surgery and specifically understands risks of bleeding, infection, nerve damage, blood clots, need for additional surgery, amputation and death.   Wylene Simmer 11-25-2013, 8:23 AM

## 2013-11-20 NOTE — Transfer of Care (Signed)
Immediate Anesthesia Transfer of Care Note  Patient: [name]  Procedure(s) Performed: Procedure(s): RIGHT TIBIALIS ANTERIOR RECONSTRUCTION GASTROC RESESSION  (Right)  Patient Location: PACU  Anesthesia Type:GA combined with regional for post-op pain  Level of Consciousness: awake, alert , oriented and patient cooperative  Airway & Oxygen Therapy: Patient Spontanous Breathing and Patient connected to face mask oxygen  Post-op Assessment: Report given to PACU RN and Post -op Vital signs reviewed and stable  Post vital signs: Reviewed and stable  Complications: No apparent anesthesia complications

## 2013-11-20 NOTE — Brief Op Note (Signed)
11/20/2013  10:35 AM  PATIENT:  [name]  63 y.o. male  PRE-OPERATIVE DIAGNOSIS:  RIGHT TIBIALIS ANTERIOR RUPTURE, TIGHT HEEL CORD   POST-OPERATIVE DIAGNOSIS:  RIGHT TIBIALIS ANTERIOR RUPTURE, TIGHT HEEL CORD   Procedure(s): 1.  Reconstruction of right tibialis anterior with plantaris autograft   2.  Right gastrocnemius recession (separate incision)   3.  AP and lateral xray of of the right foot  SURGEON:  Wylene Simmer, MD  ASSISTANT: n/a  ANESTHESIA:   General, regional  EBL:  minimal   TOURNIQUET:   Total Tourniquet Time Documented: Thigh (Right) - 79 minutes Total: Thigh (Right) - 79 minutes   COMPLICATIONS:  None apparent  DISPOSITION:  Extubated, awake and stable to recovery.  DICTATION ID:  [id number]

## 2013-11-20 NOTE — Anesthesia Procedure Notes (Addendum)
Anesthesia Regional Block:  Popliteal block  Pre-Anesthetic Checklist: ,, timeout performed, Correct Patient, Correct Site, Correct Laterality, Correct Procedure, Correct Position, site marked, Risks and benefits discussed,  Surgical consent,  Pre-op evaluation,  At surgeon's request and post-op pain management  Laterality: Right  Prep: chloraprep       Needles:  Injection technique: Single-shot  Needle Type: Echogenic Stimulator Needle     Needle Length: 9cm 9 cm Needle Gauge: 21 and 21 G    Additional Needles:  Procedures: ultrasound guided (picture in chart) and nerve stimulator Popliteal block  Nerve Stimulator or Paresthesia:  Response: tibial, 0.45 mA,  Response: peroneal, 0.5 mA,   Additional Responses:   Narrative:  Start time: 11/20/2013 8:15 AM End time: 11/20/2013 8:25 AM Injection made incrementally with aspirations every 5 mL.  Performed by: Personally  Anesthesiologist: Suzette Battiest, MD  Additional Notes: Risks and benefits explained. Pt tolerated procedure well. No immediate complications noted.   Procedure Name: LMA Insertion Date/Time: 11/20/2013 8:50 AM Performed by: Maicol Bowland Pre-anesthesia Checklist: Patient identified, Emergency Drugs available, Suction available and Patient being monitored Patient Re-evaluated:Patient Re-evaluated prior to inductionOxygen Delivery Method: Circle System Utilized Preoxygenation: Pre-oxygenation with 100% oxygen Intubation Type: IV induction Ventilation: Mask ventilation without difficulty LMA: LMA inserted LMA Size: 4.0 Number of attempts: 1 Airway Equipment and Method: bite block Placement Confirmation: positive ETCO2 Tube secured with: Tape Dental Injury: Teeth and Oropharynx as per pre-operative assessment

## 2013-11-20 NOTE — Anesthesia Postprocedure Evaluation (Signed)
  Anesthesia Post-op Note  Patient: [name]  Procedure(s) Performed: Procedure(s): RIGHT TIBIALIS ANTERIOR RECONSTRUCTION GASTROC RECESSION  (Right)  Patient Location: PACU  Anesthesia Type:GA combined with regional for post-op pain  Level of Consciousness: awake, alert  and oriented  Airway and Oxygen Therapy: Patient Spontanous Breathing  Post-op Pain: mild  Post-op Assessment: Post-op Vital signs reviewed  Post-op Vital Signs: Reviewed  Last Vitals:  Filed Vitals:   11/20/13 1130  BP: 114/72  Pulse: 75  Temp:   Resp: 13    Complications: No apparent anesthesia complications

## 2013-11-25 ENCOUNTER — Encounter (HOSPITAL_BASED_OUTPATIENT_CLINIC_OR_DEPARTMENT_OTHER): Payer: Self-pay | Admitting: Orthopedic Surgery

## 2014-01-28 ENCOUNTER — Encounter: Payer: BC Managed Care – PPO | Admitting: Internal Medicine

## 2014-04-08 ENCOUNTER — Encounter: Payer: Self-pay | Admitting: Internal Medicine

## 2014-05-26 ENCOUNTER — Ambulatory Visit (AMBULATORY_SURGERY_CENTER): Payer: Self-pay

## 2014-05-26 VITALS — Ht 70.0 in | Wt 191.8 lb

## 2014-05-26 DIAGNOSIS — Z1211 Encounter for screening for malignant neoplasm of colon: Secondary | ICD-10-CM

## 2014-05-26 MED ORDER — MOVIPREP 100 G PO SOLR: ORAL | Status: DC

## 2014-05-26 NOTE — Progress Notes (Signed)
Per pt, no allergies to soy or egg products.Pt not taking any weight loss meds or using  O2 at home. 

## 2014-06-05 ENCOUNTER — Encounter: Payer: Self-pay | Admitting: Internal Medicine

## 2014-06-09 ENCOUNTER — Encounter: Payer: Self-pay | Admitting: Internal Medicine

## 2014-06-09 ENCOUNTER — Ambulatory Visit (AMBULATORY_SURGERY_CENTER): Payer: BLUE CROSS/BLUE SHIELD | Admitting: Internal Medicine

## 2014-06-09 VITALS — BP 125/74 | HR 67 | Temp 96.1°F | Resp 20 | Ht 70.0 in | Wt 191.0 lb

## 2014-06-09 DIAGNOSIS — Z1211 Encounter for screening for malignant neoplasm of colon: Secondary | ICD-10-CM | POA: Diagnosis present

## 2014-06-09 DIAGNOSIS — D124 Benign neoplasm of descending colon: Secondary | ICD-10-CM

## 2014-06-09 DIAGNOSIS — K635 Polyp of colon: Secondary | ICD-10-CM | POA: Diagnosis not present

## 2014-06-09 DIAGNOSIS — D125 Benign neoplasm of sigmoid colon: Secondary | ICD-10-CM | POA: Diagnosis not present

## 2014-06-09 LAB — GLUCOSE, CAPILLARY
GLUCOSE-CAPILLARY: 98 mg/dL (ref 70–99)
Glucose-Capillary: 105 mg/dL — ABNORMAL HIGH (ref 70–99)

## 2014-06-09 MED ORDER — SODIUM CHLORIDE 0.9 % IV SOLN: 500.0000 mL | INTRAVENOUS | Status: DC

## 2014-06-09 NOTE — Progress Notes (Signed)
Report to PACU, RN, vss, BBS= Clear.  

## 2014-06-09 NOTE — Patient Instructions (Signed)
YOU HAD AN ENDOSCOPIC PROCEDURE TODAY AT THE [facility]:   Refer to the procedure report that was given to you for any specific questions about what was found during the examination.  If the procedure report does not answer your questions, please call your gastroenterologist to clarify.  If you requested that your care partner not be given the details of your procedure findings, then the procedure report has been included in a sealed envelope for you to review at your convenience later.  YOU SHOULD EXPECT: Some feelings of bloating in the abdomen. Passage of more gas than usual.  Walking can help get rid of the air that was put into your GI tract during the procedure and reduce the bloating. If you had a lower endoscopy (such as a colonoscopy or flexible sigmoidoscopy) you may notice spotting of blood in your stool or on the toilet paper. If you underwent a bowel prep for your procedure, you may not have a normal bowel movement for a few days.  Please Note:  You might notice some irritation and congestion in your nose or some drainage.  This is from the oxygen used during your procedure.  There is no need for concern and it should clear up in a day or so.  SYMPTOMS TO REPORT IMMEDIATELY:   Following lower endoscopy (colonoscopy or flexible sigmoidoscopy):  Excessive amounts of blood in the stool  Significant tenderness or worsening of abdominal pains  Swelling of the abdomen that is new, acute  Fever of 100F or higher    For urgent or emergent issues, a gastroenterologist can be reached at any hour by calling [phone].   DIET: Your first meal following the procedure should be a small meal and then it is ok to progress to your normal diet. Heavy or fried foods are harder to digest and may make you feel nauseous or bloated.  Likewise, meals heavy in dairy and vegetables can increase bloating.  Drink plenty of fluids but you should avoid alcoholic beverages for 24  hours.  ACTIVITY:  You should plan to take it easy for the rest of today and you should NOT DRIVE or use heavy machinery until tomorrow (because of the sedation medicines used during the test).    FOLLOW UP: Our staff will call the number listed on your records the next business day following your procedure to check on you and address any questions or concerns that you may have regarding the information given to you following your procedure. If we do not reach you, we will leave a message.  However, if you are feeling well and you are not experiencing any problems, there is no need to return our call.  We will assume that you have returned to your regular daily activities without incident.  If any biopsies were taken you will be contacted by phone or by letter within the next 1-3 weeks.  Please call us at [phone] if you have not heard about the biopsies in 3 weeks.    SIGNATURES/CONFIDENTIALITY: You and/or your care partner have signed paperwork which will be entered into your electronic medical record.  These signatures attest to the fact that that the information above on your After Visit Summary has been reviewed and is understood.  Full responsibility of the confidentiality of this discharge information lies with you and/or your care-partner.   Resume medications. Information given on polyps, diverticulosis and high fiber diet with discharge instructions. 

## 2014-06-09 NOTE — Op Note (Signed)
[place]  [address] [place], [id number]   COLONOSCOPY PROCEDURE REPORT  PATIENT: [name], [name]  MR#: [id number] BIRTHDATE: 06/17/1950 , 63  yrs. old GENDER: male ENDOSCOPIST: Eustace Quail, MD REFERRED BY:.Direct Self PROCEDURE DATE:  06/09/2014 PROCEDURE:   Colonoscopy, screening and Colonoscopy with snare polypectomy x 3 First Screening Colonoscopy - Avg.  risk and is 50 yrs.  old or older Yes.  Prior Negative Screening - Now for repeat screening. N/A  History of Adenoma - Now for follow-up colonoscopy & has been > or = to 3 yrs.  N/A ASA CLASS:   Class II INDICATIONS:Screening for colonic neoplasia and Colorectal Neoplasm Risk Assessment for this procedure is average risk.   Reports negative index exam at [facility] in 2005 MEDICATIONS: Monitored anesthesia care and Propofol 200 mg IV  DESCRIPTION OF PROCEDURE:   After the risks benefits and alternatives of the procedure were thoroughly explained, informed consent was obtained.  The digital rectal exam revealed no abnormalities of the rectum.   The LB [id number]  endoscope was introduced through the anus and advanced to the cecum, which was identified by both the appendix and ileocecal valve. No adverse events experienced.   The quality of the prep was excellent. (MoviPrep was used)  The instrument was then slowly withdrawn as the colon was fully examined.   COLON FINDINGS: Three polyps ranging between 3-38mm in size were found in the sigmoid colon and descending colon.  A polypectomy was performed with a cold snare.  The resection was complete, the polyp tissue was completely retrieved and sent to histology.   There was moderate diverticulosis noted in the left colon.   The examination was otherwise normal.  Retroflexed views revealed internal hemorrhoids. The time to cecum = 2.5 Withdrawal time = 14.6   The scope was withdrawn and the procedure completed. COMPLICATIONS: There were no  immediate complications.  ENDOSCOPIC IMPRESSION: 1.   Three polyps were found in the sigmoid colon and descending colon; polypectomy was performed with a cold snare 2.   Moderate diverticulosis was noted in the left colon 3.   The examination was otherwise normal  RECOMMENDATIONS: 1. Follow up colonoscopy in 5 years  eSigned:  Eustace Quail, MD 06/09/2014 9:21 AM   cc: The Patient and Frazier Richards, MD

## 2014-06-09 NOTE — Progress Notes (Signed)
Called to room to assist during endoscopic procedure.  Patient ID and intended procedure confirmed with present staff. Received instructions for my participation in the procedure from the performing physician.  

## 2014-06-10 ENCOUNTER — Telehealth: Payer: Self-pay | Admitting: *Deleted

## 2014-06-10 NOTE — Telephone Encounter (Signed)
  Follow up Call-  Call back number 06/09/2014  Post procedure Call Back phone  # [phone]  Permission to leave phone message Yes     Patient questions:  Do you have a fever, pain , or abdominal swelling? No. Pain Score  0 *  Have you tolerated food without any problems? Yes.    Have you been able to return to your normal activities? Yes.    Do you have any questions about your discharge instructions: Diet   No. Medications  No. Follow up visit  No.  Do you have questions or concerns about your Care? No.  Actions: * If pain score is 4 or above: No action needed, pain <4.

## 2014-06-16 ENCOUNTER — Encounter: Payer: Self-pay | Admitting: Internal Medicine

## 2015-01-18 DIAGNOSIS — M6530 Trigger finger, unspecified finger: Secondary | ICD-10-CM | POA: Insufficient documentation

## 2015-06-16 DIAGNOSIS — Z0000 Encounter for general adult medical examination without abnormal findings: Secondary | ICD-10-CM | POA: Insufficient documentation

## 2016-10-03 ENCOUNTER — Other Ambulatory Visit: Payer: Self-pay | Admitting: Urology

## 2016-10-03 DIAGNOSIS — R310 Gross hematuria: Secondary | ICD-10-CM

## 2016-10-04 ENCOUNTER — Other Ambulatory Visit: Payer: Self-pay | Admitting: Urology

## 2016-10-04 DIAGNOSIS — R310 Gross hematuria: Secondary | ICD-10-CM

## 2016-10-12 ENCOUNTER — Ambulatory Visit: Payer: BLUE CROSS/BLUE SHIELD

## 2016-10-20 NOTE — H&P (Signed)
[name], [name]           ACCOUNT NO.:  [id number]  MEDICAL RECORD NO.:  [id number]  LOCATION:                                 FACILITY:  PHYSICIAN:  [name]          DATE OF BIRTH:  [birth year]  DATE OF ADMISSION: DATE OF DISCHARGE:                            HISTORY AND PHYSICAL   SAME-DAY SURGERY:  October 31, 2016.  CHIEF COMPLAINT:  Bloody urine.  HISTORY OF PRESENT ILLNESS:  Mr. [name] is a 66 year old white male who presented to the office with a 2-1/2 month history of gross painless hematuria.  Evaluation in the office included a CT scan, which indicated normal kidneys.  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  The patient comes in now for transurethral resection of bladder tumor.  PAST MEDICAL HISTORY:  No drug allergies.  MEDICATIONS:  Include lisinopril, glimepiride, metformin ER, pravastatin, aspirin, multivitamins, fish oil, and cinnamon.  SURGICAL HISTORY: 1. Cholecystectomy, 2004. 2. Left knee surgery, 2005. 3. Umbilical herniorrhaphy, 2009. 4. Right shoulder surgery in 2012. 5. Bilateral carpal tunnel repair, 2015.  SOCIAL HISTORY:  The patient denied tobacco or alcohol use.  FAMILY HISTORY:  The patient's parents have diabetes, hypertension, and arthritis.  Father died at age 27.  Mother died at age 33.  PAST AND CURRENT MEDICAL CONDITIONS: 1. Hypertension. 2. Hyperlipidemia. 3. Diabetes. 4. Erectile dysfunction.  REVIEW OF SYSTEMS:  The patient has decreased auditory and visual acuity.  He denied weight change, fatigue, fever, chills, night sweats, visual disturbances other than decreased acuity, leg pain, shortness of breath, cough, nausea, vomiting, diarrhea, constipation, painful voiding, difficulty voiding, hematochezia, and numbness in Joseph arms and legs.  PHYSICAL EXAMINATION:  VITAL SIGNS:  Height 5 feet 9 inches, weight 188, BMI  28. GENERAL:  Well-nourished white male, in no acute distress. HEENT:  Sclerae were clear.  Pupils were equally round, reactive to light and accommodation.  Extraocular movements were intact. NECK:  Supple.  No audible carotid bruits.  No palpable cervical masses. LUNGS:  Clear to auscultation. CARDIOVASCULAR:  Regular rhythm and rate without audible murmurs. ABDOMEN:  Soft, nontender abdomen. GU:  Uncircumcised.  Testes smooth and nontender, 18 cc in size each. RECTAL:  50 g, smooth, nontender prostate. NEUROMUSCULAR:  Alert and oriented x3.  IMPRESSION: 1. Bladder tumor. 2. Gross hematuria secondary to bladder tumor. 3. Benign prostatic hypertrophy. 4. Erectile dysfunction.  PLAN:  Transurethral resection of bladder tumor.    ______________________________ [name]   ______________________________ [name]    MW/MEDQ  D:  10/19/2016  T:  10/19/2016  Job:  [id number]

## 2016-10-24 ENCOUNTER — Encounter
Admission: RE | Admit: 2016-10-24 | Discharge: 2016-10-24 | Disposition: A | Discharge status: Home / Self Care | Payer: BLUE CROSS/BLUE SHIELD | Source: Ambulatory Visit | Attending: Physical Medicine and Rehabilitation | Admitting: Physical Medicine and Rehabilitation

## 2016-10-24 NOTE — Patient Instructions (Signed)
  Your procedure is scheduled on: Tues 10/31/16 Report to Day Surgery. To find out your arrival time please call [phone] between 1PM - 3PM on Mon 10/30/16.  Remember: Instructions that are not followed completely may result in serious medical risk, up to and including death, or upon the discretion of your surgeon and anesthesiologist your surgery may need to be rescheduled.    _x___ 1. Do not eat food after midnight the night before your procedure. No gum chewing or hard candies. You may drink clear liquids up to 2 hours before you are scheduled to arrive for your surgery- DO not drink clear liquids within 2 hours of the start of your surgery.  Clear Liquids include: water, apple juice without pulp, clear carbohydrate drink such as Clearfast of Gartorade, Black Coffee or Tea (Do not add anything to coffee or tea).    _x___ 2. No Alcohol for 24 hours before or after surgery.   ____ 3. Do Not Smoke For 24 Hours Prior to Your Surgery.   ____ 4. Bring all medications with you on the day of surgery if instructed.    x____ 5. Notify your doctor if there is any change in your medical condition     (cold, fever, infections).       Do not wear jewelry, make-up, hairpins, clips or nail polish.  Do not wear lotions, powders, or perfumes. You may wear deodorant.  Do not shave 48 hours prior to surgery. Men may shave face and neck.  Do not bring valuables to the hospital.    [facility] is not responsible for any belongings or valuables.               Contacts, dentures or bridgework may not be worn into surgery.  Leave your suitcase in the car. After surgery it may be brought to your room.  For patients admitted to the hospital, discharge time is determined by your                treatment team.   Patients discharged the day of surgery will not be allowed to drive home.   Please read over the following fact sheets that you were given:      __x__ Take these medicines the  morning of surgery with A SIP OF WATER:    1. acetaminophen (TYLENOL) 650 MG CR tablet if needed  2.   3.   4.  5.  6.  ____ Fleet Enema (as directed)   ____ Use CHG Soap as directed  ____ Use inhalers on the day of surgery  _x___ Stop metformin 2 days prior to surgery 10/28/16    ____ Take 1/2 of usual insulin dose the night before surgery and none on the morning of surgery.   __x__ Stop aspirin on  today  _x___ Stop Anti-inflammatories Advil, ibuprofen, aleve.  Tylenol only   __x__ Stop supplements until after surgery.  today  ____ Bring C-Pap to the hospital.

## 2016-10-27 ENCOUNTER — Encounter
Admission: RE | Admit: 2016-10-27 | Discharge: 2016-10-27 | Disposition: A | Discharge status: Home / Self Care | Payer: BLUE CROSS/BLUE SHIELD | Source: Ambulatory Visit | Attending: Urology | Admitting: Urology

## 2016-10-27 DIAGNOSIS — I10 Essential (primary) hypertension: Secondary | ICD-10-CM | POA: Insufficient documentation

## 2016-10-31 ENCOUNTER — Encounter
Admission: RE | Disposition: A | Discharge status: Home / Self Care | Payer: Self-pay | Source: Ambulatory Visit | Attending: Urology

## 2016-10-31 ENCOUNTER — Ambulatory Visit: Payer: BLUE CROSS/BLUE SHIELD | Admitting: Anesthesiology

## 2016-10-31 ENCOUNTER — Ambulatory Visit
Admission: RE | Admit: 2016-10-31 | Discharge: 2016-10-31 | Disposition: A | Discharge status: Home / Self Care | Payer: BLUE CROSS/BLUE SHIELD | Source: Ambulatory Visit | Attending: Urology | Admitting: Urology

## 2016-10-31 ENCOUNTER — Encounter: Payer: Self-pay | Admitting: *Deleted

## 2016-10-31 DIAGNOSIS — C674 Malignant neoplasm of posterior wall of bladder: Secondary | ICD-10-CM | POA: Diagnosis present

## 2016-10-31 DIAGNOSIS — R310 Gross hematuria: Secondary | ICD-10-CM | POA: Diagnosis not present

## 2016-10-31 DIAGNOSIS — I10 Essential (primary) hypertension: Secondary | ICD-10-CM | POA: Diagnosis not present

## 2016-10-31 DIAGNOSIS — Z7982 Long term (current) use of aspirin: Secondary | ICD-10-CM | POA: Diagnosis not present

## 2016-10-31 DIAGNOSIS — E119 Type 2 diabetes mellitus without complications: Secondary | ICD-10-CM | POA: Diagnosis not present

## 2016-10-31 DIAGNOSIS — E785 Hyperlipidemia, unspecified: Secondary | ICD-10-CM | POA: Diagnosis not present

## 2016-10-31 DIAGNOSIS — Z79899 Other long term (current) drug therapy: Secondary | ICD-10-CM | POA: Insufficient documentation

## 2016-10-31 DIAGNOSIS — Z7984 Long term (current) use of oral hypoglycemic drugs: Secondary | ICD-10-CM | POA: Diagnosis not present

## 2016-10-31 DIAGNOSIS — N400 Enlarged prostate without lower urinary tract symptoms: Secondary | ICD-10-CM | POA: Insufficient documentation

## 2016-10-31 DIAGNOSIS — N529 Male erectile dysfunction, unspecified: Secondary | ICD-10-CM | POA: Insufficient documentation

## 2016-10-31 HISTORY — PX: TRANSURETHRAL RESECTION OF BLADDER TUMOR: SHX2575

## 2016-10-31 HISTORY — DX: Malignant (primary) neoplasm, unspecified: C80.1

## 2016-10-31 LAB — GLUCOSE, CAPILLARY
GLUCOSE-CAPILLARY: 103 mg/dL — AB (ref 65–99)
Glucose-Capillary: 105 mg/dL — ABNORMAL HIGH (ref 65–99)

## 2016-10-31 SURGERY — TURBT (TRANSURETHRAL RESECTION OF BLADDER TUMOR)
Anesthesia: General | Site: Bladder | Wound class: Clean Contaminated

## 2016-10-31 MED ORDER — PROPOFOL 10 MG/ML IV BOLUS
INTRAVENOUS | Status: DC | PRN
  Administered 2016-10-31: 170 mg via INTRAVENOUS

## 2016-10-31 MED ORDER — MIDAZOLAM HCL 2 MG/2ML IJ SOLN
INTRAMUSCULAR | Status: AC
  Filled 2016-10-31: qty 2

## 2016-10-31 MED ORDER — DEXAMETHASONE SODIUM PHOSPHATE 10 MG/ML IJ SOLN
INTRAMUSCULAR | Status: AC
  Filled 2016-10-31: qty 1

## 2016-10-31 MED ORDER — MITOMYCIN CHEMO FOR BLADDER INSTILLATION 40 MG
40.0000 mg | Freq: Once | INTRAVENOUS | Status: DC
  Filled 2016-10-31: qty 40

## 2016-10-31 MED ORDER — LIDOCAINE HCL 2 % EX GEL
CUTANEOUS | Status: AC
  Filled 2016-10-31: qty 10

## 2016-10-31 MED ORDER — TAMSULOSIN HCL 0.4 MG PO CAPS: 0.4000 mg | ORAL_CAPSULE | Freq: Every day | ORAL | 11 refills | Status: DC

## 2016-10-31 MED ORDER — FAMOTIDINE 20 MG PO TABS
20.0000 mg | ORAL_TABLET | Freq: Once | ORAL | Status: AC
  Administered 2016-10-31: 20 mg via ORAL

## 2016-10-31 MED ORDER — EPHEDRINE SULFATE 50 MG/ML IJ SOLN
INTRAMUSCULAR | Status: DC | PRN
  Administered 2016-10-31: 10 mg via INTRAVENOUS

## 2016-10-31 MED ORDER — SODIUM CHLORIDE 0.9 % IV SOLN: INTRAVENOUS | Status: DC

## 2016-10-31 MED ORDER — FENTANYL CITRATE (PF) 100 MCG/2ML IJ SOLN: 25.0000 ug | INTRAMUSCULAR | Status: DC | PRN

## 2016-10-31 MED ORDER — CIPROFLOXACIN HCL 500 MG PO TABS: 500.0000 mg | ORAL_TABLET | Freq: Two times a day (BID) | ORAL | 0 refills | Status: DC

## 2016-10-31 MED ORDER — FENTANYL CITRATE (PF) 100 MCG/2ML IJ SOLN
INTRAMUSCULAR | Status: AC
  Filled 2016-10-31: qty 2

## 2016-10-31 MED ORDER — ONDANSETRON HCL 4 MG/2ML IJ SOLN
INTRAMUSCULAR | Status: DC | PRN
  Administered 2016-10-31: 4 mg via INTRAVENOUS

## 2016-10-31 MED ORDER — DEXAMETHASONE SODIUM PHOSPHATE 10 MG/ML IJ SOLN
INTRAMUSCULAR | Status: DC | PRN
  Administered 2016-10-31: 10 mg via INTRAVENOUS

## 2016-10-31 MED ORDER — ONDANSETRON HCL 4 MG/2ML IJ SOLN
INTRAMUSCULAR | Status: AC
  Filled 2016-10-31: qty 2

## 2016-10-31 MED ORDER — LEVOFLOXACIN IN D5W 500 MG/100ML IV SOLN
INTRAVENOUS | Status: AC
  Filled 2016-10-31: qty 100

## 2016-10-31 MED ORDER — LEVOFLOXACIN IN D5W 500 MG/100ML IV SOLN
500.0000 mg | Freq: Once | INTRAVENOUS | Status: AC
  Administered 2016-10-31: 500 mg via INTRAVENOUS

## 2016-10-31 MED ORDER — URIBEL 118 MG PO CAPS: 1.0000 | ORAL_CAPSULE | Freq: Four times a day (QID) | ORAL | 3 refills | Status: DC | PRN

## 2016-10-31 MED ORDER — FAMOTIDINE 20 MG PO TABS
ORAL_TABLET | ORAL | Status: AC
  Administered 2016-10-31: 20 mg via ORAL
  Filled 2016-10-31: qty 1

## 2016-10-31 MED ORDER — MIDAZOLAM HCL 2 MG/2ML IJ SOLN
INTRAMUSCULAR | Status: DC | PRN
  Administered 2016-10-31: 2 mg via INTRAVENOUS

## 2016-10-31 MED ORDER — FENTANYL CITRATE (PF) 100 MCG/2ML IJ SOLN
INTRAMUSCULAR | Status: DC | PRN
  Administered 2016-10-31: 50 ug via INTRAVENOUS

## 2016-10-31 MED ORDER — ONDANSETRON HCL 4 MG/2ML IJ SOLN: 4.0000 mg | Freq: Once | INTRAMUSCULAR | Status: DC | PRN

## 2016-10-31 MED ORDER — MITOMYCIN CHEMO FOR BLADDER INSTILLATION 40 MG
INTRAVENOUS | Status: DC | PRN
  Administered 2016-10-31: 40 mg via INTRAVESICAL

## 2016-10-31 MED ORDER — PROPOFOL 10 MG/ML IV BOLUS
INTRAVENOUS | Status: AC
  Filled 2016-10-31: qty 20

## 2016-10-31 MED ORDER — LACTATED RINGERS IV SOLN
INTRAVENOUS | Status: DC | PRN
  Administered 2016-10-31: 14:00:00 via INTRAVENOUS

## 2016-10-31 MED ORDER — LIDOCAINE HCL 2 % EX GEL
CUTANEOUS | Status: DC | PRN
  Administered 2016-10-31: 1

## 2016-10-31 SURGICAL SUPPLY — 20 items
BAG DRAIN CYSTO-URO LG1000N (MISCELLANEOUS) ×2 IMPLANT
BAG URO DRAIN 2000ML W/SPOUT (MISCELLANEOUS) ×2 IMPLANT
CATH FOLEY 2WAY  5CC 20FR SIL (CATHETERS) ×1
CATH FOLEY 2WAY 5CC 20FR SIL (CATHETERS) ×1 IMPLANT
DRSG TELFA 4X3 1S NADH ST (GAUZE/BANDAGES/DRESSINGS) ×2 IMPLANT
ELECT LOOP 22F BIPOLAR SML (ELECTROSURGICAL)
ELECTRODE LOOP 22F BIPOLAR SML (ELECTROSURGICAL) IMPLANT
GLOVE BIO SURGEON STRL SZ7.5 (GLOVE) ×4 IMPLANT
GOWN STRL REUS W/ TWL LRG LVL4 (GOWN DISPOSABLE) ×1 IMPLANT
GOWN STRL REUS W/ TWL XL LVL3 (GOWN DISPOSABLE) ×1 IMPLANT
GOWN STRL REUS W/TWL LRG LVL4 (GOWN DISPOSABLE) ×1
GOWN STRL REUS W/TWL XL LVL3 (GOWN DISPOSABLE) ×1
KIT RM TURNOVER CYSTO AR (KITS) ×2 IMPLANT
LOOP CUT BIPOLAR 24F LRG (ELECTROSURGICAL) ×2 IMPLANT
PACK CYSTO AR (MISCELLANEOUS) ×2 IMPLANT
SET IRRIG Y TYPE TUR BLADDER L (SET/KITS/TRAYS/PACK) ×4 IMPLANT
SOL .9 NS 3000ML IRR  AL (IV SOLUTION) ×4
SOL .9 NS 3000ML IRR UROMATIC (IV SOLUTION) ×4 IMPLANT
SYRINGE IRR TOOMEY STRL 70CC (SYRINGE) ×2 IMPLANT
WATER STERILE IRR 1000ML POUR (IV SOLUTION) ×2 IMPLANT

## 2016-10-31 NOTE — Anesthesia Preprocedure Evaluation (Signed)
Anesthesia Evaluation  Patient identified by MRN, date of birth, ID band Patient awake    Reviewed: Allergy & Precautions, NPO status , Patient's Chart, lab work & pertinent test results  History of Anesthesia Complications (+) PONV and history of anesthetic complications  Airway Mallampati: II       Dental   Pulmonary neg sleep apnea, neg COPD,           Cardiovascular hypertension, Pt. on medications (-) Past MI and (-) CHF (-) dysrhythmias (-) Valvular Problems/Murmurs     Neuro/Psych neg Seizures    GI/Hepatic Neg liver ROS, neg GERD  ,  Endo/Other  diabetes, Type 2, Oral Hypoglycemic Agents  Renal/GU negative Renal ROS     Musculoskeletal   Abdominal   Peds  Hematology   Anesthesia Other Findings   Reproductive/Obstetrics                             Anesthesia Physical Anesthesia Plan  ASA: II  Anesthesia Plan: General   Post-op Pain Management:    Induction: Intravenous  PONV Risk Score and Plan: 3 and Ondansetron, Dexamethasone, Midazolam and Treatment may vary due to age or medical condition  Airway Management Planned: LMA  Additional Equipment:   Intra-op Plan:   Post-operative Plan:   Informed Consent: I have reviewed the patients History and Physical, chart, labs and discussed the procedure including the risks, benefits and alternatives for the proposed anesthesia with the patient or authorized representative who has indicated his/her understanding and acceptance.     Plan Discussed with:   Anesthesia Plan Comments:         Anesthesia Quick Evaluation

## 2016-10-31 NOTE — Transfer of Care (Signed)
Immediate Anesthesia Transfer of Care Note  Patient: HAKEEN SHIPES  Procedure(s) Performed: Procedure(s): TRANSURETHRAL RESECTION OF BLADDER TUMOR (TURBT) (N/A)  Patient Location: PACU  Anesthesia Type:General  Level of Consciousness: sedated  Airway & Oxygen Therapy: Patient Spontanous Breathing and Patient connected to face mask oxygen  Post-op Assessment: Report given to RN and Post -op Vital signs reviewed and stable  Post vital signs: Reviewed and stable  Last Vitals:  Vitals:   10/31/16 1159 10/31/16 1506  BP: (!) 143/85 (!) 141/87  Pulse: 66 69  Resp: 16 15  Temp: (!) 35.7 C (!) 36.3 C  SpO2: 100% 100%    Last Pain:  Vitals:   10/31/16 1159  TempSrc: Tympanic  PainSc: 0-No pain      Patients Stated Pain Goal: 0 (40/81/44 8185)  Complications: No apparent anesthesia complications

## 2016-10-31 NOTE — Anesthesia Post-op Follow-up Note (Signed)
Anesthesia QCDR form completed.        

## 2016-10-31 NOTE — Anesthesia Postprocedure Evaluation (Signed)
Anesthesia Post Note  Patient: [name]  Procedure(s) Performed: Procedure(s) (LRB): TRANSURETHRAL RESECTION OF BLADDER TUMOR (TURBT) (N/A)  Patient location during evaluation: PACU Anesthesia Type: General Level of consciousness: awake and alert Pain management: pain level controlled Vital Signs Assessment: post-procedure vital signs reviewed and stable Respiratory status: spontaneous breathing and respiratory function stable Cardiovascular status: stable Anesthetic complications: no     Last Vitals:  Vitals:   10/31/16 1506 10/31/16 1521  BP: (!) 141/87 140/85  Pulse: 69 88  Resp: 15 19  Temp: (!) 36.3 C   SpO2: 100% 99%    Last Pain:  Vitals:   10/31/16 1506  TempSrc:   PainSc: Asleep                 KEPHART,WILLIAM K

## 2016-10-31 NOTE — H&P (Signed)
Date of Initial H&P: 10/19/16  History reviewed, patient examined, no change in status, stable for surgery.

## 2016-10-31 NOTE — Discharge Instructions (Addendum)
Transurethral Resection of Bladder Tumor Transurethral resection of a bladder tumor is the removal (resection) of a cancerous growth (tumor) on the inside wall of the bladder. The bladder is the organ that holds urine. The tumor is removed through the tube that carries urine out of the body (urethra). In a transurethral resection, a thin telescope with a light, a tiny camera, and an electric cutting edge (resectoscope) is passed through the urethra. In men, the opening of the urethra is at the end of the penis. In women, it is just above the vaginal opening. Tell a health care provider about:  Any allergies you have.  All medicines you are taking, including vitamins, herbs, eye drops, creams, and over-the-counter medicines.  Any problems you or family members have had with anesthetic medicines.  Any blood disorders you have.  Any surgeries you have had.  Any medical conditions you have.  Any recent urinary tract infections you have had.  Whether you are pregnant or may be pregnant. What are the risks? Generally, this is a safe procedure. However, problems may occur, including:  Infection.  Bleeding.  Allergic reactions to medicines.  Damage to other structures or organs, such as: ? The urethra. ? The tubes that drain urine from the kidneys into the bladder (ureters).  Pain and burning during urination.  Difficulty urinating due to partial blockage of the urethra.  Inability to urinate (urinary retention).  What happens before the procedure?  Follow instructions from your health care provider about eating and drinking restrictions.  Ask your health care provider about: ? Changing or stopping your regular medicines. This is especially important if you are taking diabetes medicines or blood thinners. ? Taking medicines such as aspirin and ibuprofen. These medicines can thin your blood. Do not take these medicines before your procedure if your health care provider instructs  you not to.  You may have a physical exam.  You may have tests, including: ? Blood tests. ? Urine tests. ? Electrocardiogram (ECG). This test measures the electrical activity of the heart.  You may be given antibiotic medicine to help prevent infection.  Ask your health care provider how your surgical site will be marked or identified.  Plan to have someone take you home after the procedure. What happens during the procedure?  To reduce your risk of infection: ? Your health care team will wash or sanitize their hands. ? Your skin will be washed with soap.  An IV tube will be inserted into one of your veins.  You will be given one or more of the following: ? A medicine to help you relax (sedative). ? A medicine to make you fall asleep (general anesthetic). ? A medicine that is injected into your spine to numb the area below and slightly above the injection site (spinal anesthetic).  Your legs will be placed in foot rests (stirrups) so that your legs are apart and your knees are bent.  The resectoscope will be passed through your urethra and into your bladder.  The part of your bladder that is affected by the tumor will be resected using the cutting edge of the resectoscope.  The resectoscope will be removed.  A thin, flexible tube (catheter) will be passed through your urethra and into your bladder. The catheter will drain urine into a bag outside of your body. ? Fluid may be passed through the catheter to keep the catheter open. The procedure may vary among health care providers and hospitals. What happens after the procedure?  Your blood pressure, heart rate, breathing rate, and blood oxygen level will be monitored often until the medicines you were given have worn off.  You may continue to receive fluids and medicines through an IV tube.  You will have some pain. Pain medicine will be available to help you.  You will have a catheter draining your urine. ? You will  have blood in your urine. Your catheter may be kept in until your urine is clear. ? Your urinary drainage will be monitored. If necessary, your bladder may be rinsed out (irrigated) by passing fluid through your catheter.  You will be encouraged to walk around as soon as possible.  You may have to wear compression stockings. These stockings help to prevent blood clots and reduce swelling in your legs.  Do not drive for 24 hours if you received a sedative. This information is not intended to replace advice given to you by your health care provider. Make sure you discuss any questions you have with your health care provider. Document Released: 11/19/2008 Document Revised: 09/26/2015 Document Reviewed: 10/15/2014 Elsevier Interactive Patient Education  2018 [place]. Transurethral Resection of Bladder Tumor, Care After Refer to this sheet in the next few weeks. These instructions provide you with information about caring for yourself after your procedure. Your health care provider may also give you more specific instructions. Your treatment has been planned according to current medical practices, but problems sometimes occur. Call your health care provider if you have any problems or questions after your procedure. What can I expect after the procedure? After the procedure, it is common to have:  A small amount of blood in your urine for up to 2 weeks.  Soreness or mild discomfort from your catheter. After your catheter is removed, you may have mild soreness, especially when urinating.  Pain in your lower abdomen.  Follow these instructions at home:  Medicines  Take over-the-counter and prescription medicines only as told by your health care provider.  Do not drive or operate heavy machinery while taking prescription pain medicine.  Do not drive for 24 hours if you received a sedative.  If you were prescribed antibiotic medicine, take it as told by your health care provider. Do not  stop taking the antibiotic even if you start to feel better. Activity  Return to your normal activities as told by your health care provider. Ask your health care provider what activities are safe for you.  Do not lift anything that is heavier than 10 lb (4.5 kg) for as long as told by your health care provider.  Avoid intense physical activity for as long as told by your health care provider.  Walk at least one time every day. This helps to prevent blood clots. You may increase your physical activity gradually as you start to feel better. General instructions  Do not drink alcohol for as long as told by your health care provider. This is especially important if you are taking prescription pain medicines.  Do not take baths, swim, or use a hot tub until your health care provider approves.  If you have a catheter, follow instructions from your health care provider about caring for your catheter and your drainage bag.  Drink enough fluid to keep your urine clear or pale yellow.  Wear compression stockings as told by your health care provider. These stockings help to prevent blood clots and reduce swelling in your legs.  Keep all follow-up visits as told by your health care provider.  This is important. Contact a health care provider if:  You have pain that gets worse or does not improve with medicine.  You have blood in your urine for more than 2 weeks.  You have cloudy or bad-smelling urine.  You become constipated. Signs of constipation may include having: ? Fewer than three bowel movements in a week. ? Difficulty having a bowel movement. ? Stools that are dry, hard, or larger than normal.  You have a fever. Get help right away if:  You have: ? Severe pain. ? Bright-red blood in your urine. ? Blood clots in your urine. ? A lot of blood in your urine.  Your catheter has been removed and you are not able to urinate.  You have a catheter in place and the catheter is not  draining urine. This information is not intended to replace advice given to you by your health care provider. Make sure you discuss any questions you have with your health care provider. Document Released: 01/04/2015 Document Revised: 09/26/2015 Document Reviewed: 10/15/2014 Elsevier Interactive Patient Education  2018 [place]   1) The drugs that you were given will stay in your system until tomorrow so for the next 24 hours you should not:  A) Drive an automobile B) Make any legal decisions C) Drink any alcoholic beverage   2) You may resume regular meals tomorrow.  Today it is better to start with liquids and gradually work up to solid foods.  You may eat anything you prefer, but it is better to start with liquids, then soup and crackers, and gradually work up to solid foods.   3) Please notify your doctor immediately if you have any unusual bleeding, trouble breathing, redness and pain at the surgery site, drainage, fever, or pain not relieved by medication.    4) Additional Instructions:        Please contact your physician with any problems or Same Day Surgery at [phone], Monday through Friday 6 am to 4 pm, or [place] at [facility] number at [phone].

## 2016-10-31 NOTE — Anesthesia Procedure Notes (Signed)
Procedure Name: LMA Insertion Date/Time: 10/31/2016 2:32 PM Performed by: [facility] Pre-anesthesia Checklist: Patient identified, Patient being monitored, Timeout performed, Emergency Drugs available and Suction available Patient Re-evaluated:Patient Re-evaluated prior to induction Oxygen Delivery Method: Circle system utilized Preoxygenation: Pre-oxygenation with 100% oxygen Induction Type: IV induction Ventilation: Mask ventilation without difficulty LMA: LMA inserted LMA Size: 4.5 Tube type: Oral Number of attempts: 1 Placement Confirmation: positive ETCO2 and breath sounds checked- equal and bilateral Tube secured with: Tape Dental Injury: Teeth and Oropharynx as per pre-operative assessment

## 2016-10-31 NOTE — Op Note (Signed)
Preoperative diagnosis: Bladder cancer  Postoperative diagnosis: Same   Procedure: 1. Transurethral resection of bladder tumor                      2. Instillation of mitomycin-C into the bladder    Surgeon: Otelia Limes. Yves Dill MD  Anesthesia: General  Indications:See the history and physical. After informed consent the above procedure(s) were requested     Technique and findings: After adequate general anesthesia been obtained patient was placed into dorsolithotomy position and the perineum was prepped and draped in usual fashion. The 24 French resection sheath was then advanced into the bladder with the obturator in place. The resectoscope was coupled the camera and then placed into the sheath. The 20 x 20 mm tumor was identified at the posterior bladder wall. The tumor was papillary and attached by a small stalk. Using the loop electrode the tumor was removed and specimens sent to pathology. The base of tumor was cauterized. No other bladder lesions were identified. Both ureteral orifices were identified and had clear reflux. The patient had lateral lobe prostatic hypertrophy. The resection scope was then removed and 20 French catheter placed. 40 mg of mitomycin-C was instilled within the bladder and the catheter was loaded. 10 cc of viscous Xylocaine was instilled into the urethra. The procedure was then terminated and patient transferred to the recovery room in stable condition. Blood loss was minimal.

## 2016-11-01 ENCOUNTER — Encounter: Payer: Self-pay | Admitting: Urology

## 2016-11-02 LAB — SURGICAL PATHOLOGY

## 2017-02-21 DIAGNOSIS — M25562 Pain in left knee: Secondary | ICD-10-CM | POA: Insufficient documentation

## 2017-03-05 DIAGNOSIS — Z8551 Personal history of malignant neoplasm of bladder: Secondary | ICD-10-CM | POA: Insufficient documentation

## 2017-07-30 ENCOUNTER — Ambulatory Visit (INDEPENDENT_AMBULATORY_CARE_PROVIDER_SITE_OTHER): Payer: BLUE CROSS/BLUE SHIELD | Admitting: Podiatry

## 2017-07-30 ENCOUNTER — Ambulatory Visit: Payer: Self-pay

## 2017-07-30 VITALS — BP 132/72 | HR 76 | Resp 16

## 2017-07-30 DIAGNOSIS — M778 Other enthesopathies, not elsewhere classified: Secondary | ICD-10-CM

## 2017-07-30 DIAGNOSIS — Q828 Other specified congenital malformations of skin: Secondary | ICD-10-CM | POA: Diagnosis not present

## 2017-07-30 DIAGNOSIS — M779 Enthesopathy, unspecified: Principal | ICD-10-CM

## 2017-07-30 NOTE — Progress Notes (Signed)
Subjective:  Patient ID: [name], male    DOB: May 28, 1950,  MRN: [id number] HPI Chief Complaint  Patient presents with  . Callouses    Plantar forefoot bilateral (R>L) - tiny, callused areas x several months, hurts to walk barefoot, tried filing down but no help-had these areas treated here in the past  . New Patient (Initial Visit)    Est pt - 3+    67 y.o. male presents with the above complaint.   ROS: Denies fever chills nausea vomiting muscle aches pains calf pain back pain chest pain shortness of breath.  Past Medical History:  Diagnosis Date  . Arthritis   . Cancer ([place]) 2007   basal cell ca on nose....had Mohs done  . Diabetes mellitus without complication ([place])   . Diabetic peripheral neuropathy ([place])   . Hypertension   . Post-operative nausea and vomiting   . Post-operative nausea and vomiting    one time  . Wears glasses    Past Surgical History:  Procedure Laterality Date  . BASAL CELL CARCINOMA EXCISION     on nose  . CARPAL TUNNEL RELEASE Bilateral   . CHOLECYSTECTOMY  2004   lap choli  . COLONOSCOPY    . GASTROC RECESSION EXTREMITY Right 11/20/2013   Procedure: GASTROC RECESSION EXTREMITY;  Surgeon: Wylene Simmer, MD;  Location: [facility];  Service: Orthopedics;  Laterality: Right;  . HERNIA REPAIR  2009   umb  . KNEE ARTHROSCOPY Left   . SHOULDER ARTHROSCOPY  2012   right  . TENDON REPAIR Right 11/20/2013   Procedure: RIGHT TIBIALIS ANTERIOR RECONSTRUCTION GASTROC RECESSION ;  Surgeon: Wylene Simmer, MD;  Location: [facility];  Service: Orthopedics;  Laterality: Right;  . TRANSURETHRAL RESECTION OF BLADDER TUMOR N/A 10/31/2016   Procedure: TRANSURETHRAL RESECTION OF BLADDER TUMOR (TURBT);  Surgeon: Royston Cowper, MD;  Location: [facility];  Service: Urology;  Laterality: N/A;    Current Outpatient Medications:  .  CINNAMON PO, Take 1 tablet by mouth daily. , Disp: , Rfl:  .  glimepiride (AMARYL) 1 MG tablet,  Take 1 mg by mouth daily with breakfast., Disp: , Rfl:  .  lisinopril (PRINIVIL,ZESTRIL) 10 MG tablet, Take 10 mg by mouth daily., Disp: , Rfl:  .  metFORMIN (GLUCOPHAGE-XR) 500 MG 24 hr tablet, Take 2,000 mg by mouth daily with breakfast., Disp: , Rfl:  .  Multiple Vitamins-Minerals (MULTIVITAMIN WITH MINERALS) tablet, Take 1 tablet by mouth daily., Disp: , Rfl:  .  Omega-3 Fatty Acids (FISH OIL) 1000 MG CAPS, Take 1,000 mg by mouth daily. , Disp: , Rfl:  .  pravastatin (PRAVACHOL) 80 MG tablet, Take 80 mg by mouth daily., Disp: , Rfl:   No Known Allergies Review of Systems Objective:   Vitals:   07/30/17 1629  BP: 132/72  Pulse: 76  Resp: 16    General: Well developed, nourished, in no acute distress, alert and oriented x3   Dermatological: Skin is warm, dry and supple bilateral. Nails x 10 are well maintained; remaining integument appears unremarkable at this time. There are no open sores, no preulcerative lesions, no rash or signs of infection present.  Reactive hyperkeratosis sub-second metatarsophalangeal joints bilateral.  The nucleated today  Vascular: Dorsalis Pedis artery and Posterior Tibial artery pedal pulses are 2/4 bilateral with immedate capillary fill time. Pedal hair growth present. No varicosities and no lower extremity edema present bilateral.   Neruologic: Grossly intact via light touch bilateral. Vibratory intact via tuning  fork bilateral. Protective threshold with Semmes Wienstein monofilament intact to all pedal sites bilateral. Patellar and Achilles deep tendon reflexes 2+ bilateral. No Babinski or clonus noted bilateral.   Musculoskeletal: No gross boney pedal deformities bilateral. No pain, crepitus, or limitation noted with foot and ankle range of motion bilateral. Muscular strength 5/5 in all groups tested bilateral.  Gait: Unassisted, Nonantalgic.    Radiographs:  None taken  Assessment & Plan:   Assessment: Porokeratosis forefoot bilateral  Plan:  Debrided reactive hyperkeratosis placed Cantharone under occlusion to be washed off thoroughly tomorrow.     Max T. [place], [place]

## 2018-04-19 ENCOUNTER — Ambulatory Visit: Payer: Self-pay | Admitting: Orthopedic Surgery

## 2018-04-19 NOTE — H&P (Signed)
[name] is an 68 y.o. male.   Chief Complaint: back and leg pain HPI: Reason for Visit: (normal) visit for: (back) Context: The patient is 3 weeks out from lifting injury. Location (Lower Extremity): leg pain on the right, , ; foot pain on the right, , ; right buttock Severity: pain level 5/10 Timing: constant Quality: burning Aggravating Factors: walking for ; sitting for Medications: The patient iis taking Gabapentin and Tylenol #3. Notes: The patient is 8 days out from R ESI L4-5. The patient states that it helped for 2 days. He is still in a lot of pain. He wants a refill on his pain medicine. He is unable to tolerate request for jury duty.  Like a refill on his Tylenol No. 3.  He reports of buttock and hip pain since in the fall. He has seen a chiropractor for that. Worse upon a lifting event 3 weeks ago  Past Medical History:  Diagnosis Date  . Arthritis   . Cancer ([place]) 2007   basal cell ca on nose....had Mohs done  . Diabetes mellitus without complication ([place])   . Diabetic peripheral neuropathy ([place])   . Hypertension   . Post-operative nausea and vomiting   . Post-operative nausea and vomiting    one time  . Wears glasses     Past Surgical History:  Procedure Laterality Date  . BASAL CELL CARCINOMA EXCISION     on nose  . CARPAL TUNNEL RELEASE Bilateral   . CHOLECYSTECTOMY  2004   lap choli  . COLONOSCOPY    . GASTROC RECESSION EXTREMITY Right 11/20/2013   Procedure: GASTROC RECESSION EXTREMITY;  Surgeon: Wylene Simmer, MD;  Location: [facility];  Service: Orthopedics;  Laterality: Right;  . HERNIA REPAIR  2009   umb  . KNEE ARTHROSCOPY Left   . SHOULDER ARTHROSCOPY  2012   right  . TENDON REPAIR Right 11/20/2013   Procedure: RIGHT TIBIALIS ANTERIOR RECONSTRUCTION GASTROC RECESSION ;  Surgeon: Wylene Simmer, MD;  Location: [facility];  Service: Orthopedics;  Laterality: Right;  . TRANSURETHRAL RESECTION OF BLADDER TUMOR  N/A 10/31/2016   Procedure: TRANSURETHRAL RESECTION OF BLADDER TUMOR (TURBT);  Surgeon: Royston Cowper, MD;  Location: [facility];  Service: Urology;  Laterality: N/A;    Family History  Problem Relation Age of Onset  . Diabetes Mother   . Alzheimer's disease Mother   . Diabetes Brother    Social History:  reports that he has never smoked. He has never used smokeless tobacco. He reports that he does not drink alcohol or use drugs.  Allergies: No Known Allergies  Medications: gabapentin 300 mg capsule glimepiride 1 mg tablet lisinopriL 10 mg tablet metFORMIN ER 500 mg tablet,extended release 24 hr methocarbamoL 500 mg tablet pravastatin 80 mg tablet Tylenol-Codeine #3 300 mg-30 mg tablet  Review of Systems  Constitutional: Negative.   HENT: Negative.   Eyes: Negative.   Respiratory: Negative.   Cardiovascular: Negative.   Gastrointestinal: Negative.   Genitourinary: Negative.   Musculoskeletal: Positive for back pain.  Skin: Negative.   Neurological: Positive for sensory change and focal weakness.  Psychiatric/Behavioral: Negative.     There were no vitals taken for this visit. Physical Exam  Constitutional: He is oriented to person, place, and time. He appears well-developed and well-nourished.  HENT:  Head: Normocephalic.  Eyes: Pupils are equal, round, and reactive to light.  Neck: Normal range of motion.  Cardiovascular: Normal rate.  Respiratory: Effort normal.  GI:  Soft.  Musculoskeletal:     Comments: Patient is a 68 year old male.  Gait and Station: Appearance: ambulating with no assistive devices and antalgic gait.  Constitutional: General Appearance: healthy-appearing and distress; Moderate distress.  Psychiatric: Mood and Affect: active and alert.  Cardiovascular System: Edema Right: none; Dorsalis and posterior tibial pulses 2+. Edema Left: none.  Abdomen: Inspection and Palpation: non-distended and no tenderness.  Skin: Inspection and palpation:  no rash.  Lumbar Spine: Inspection: normal alignment. Bony Palpation of the Lumbar Spine: tender at lumbosacral junction.. Bony Palpation of the Right Hip: no tenderness of the greater trochanter and tenderness of the SI joint; Pelvis stable. Bony Palpation of the Left Hip: no tenderness of the greater trochanter and tenderness of the SI joint. Soft Tissue Palpation on the Right: No flank pain with percussion. Active Range of Motion: limited flexion and extention.  Motor Strength: L1 Motor Strength on the Right: hip flexion iliopsoas 5/5. L1 Motor Strength on the Left: hip flexion iliopsoas 5/5. L2-L4 Motor Strength on the Right: knee extension quadriceps 5/5. L2-L4 Motor Strength on the Left: knee extension quadriceps 5/5. L5 Motor Strength on the Right: ankle dorsiflexion tibialis anterior 5/5 and great toe extension extensor hallucis longus 4/5. L5 Motor Strength on the Left: ankle dorsiflexion tibialis anterior 5/5 and great toe extension extensor hallucis longus 5/5. S1 Motor Strength on the Right: plantar flexion gastrocnemius 5/5. S1 Motor Strength on the Left: plantar flexion gastrocnemius 5/5.  Neurological System: Knee Reflex Right: normal (2). Knee Reflex Left: normal (2). Ankle Reflex Right: normal (2). Ankle Reflex Left: normal (2). Babinski Reflex Right: plantar reflex absent. Babinski Reflex Left: plantar reflex absent. Sensation on the Right: normal distal extremities. Sensation on the Left: normal distal extremities. Special Tests on the Right: no clonus of the ankle/knee and seated straight leg raising test positive. Special Tests on the Left: no clonus of the ankle/knee and seated straight leg raising test positive.  Normal cervical lordosis. No pain with range of motion. No palpable tenderness. Motor is 5/5 in all groups in the upper extremities. Upper extremity sensory exam normal. Patient is normoreflexic in the upper extremities. No Hoffmann sign.  Neurological: He is alert and  oriented to person, place, and time.  Skin: Skin is warm and dry.    MRI demonstrates an extruded disc herniation L4-5 to the right displacing the L5 nerve root.  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  He has actually had symptoms since the fall. It is now worsen.  Discussed activity modification.  Given the lack of improvement those persistent pain in the presence of a neurologic deficit we discussed proceeding with a microlumbar decompression L4-5  I had an extensive discussion with the patient concerning the pathology relevant anatomy and treatment options. At this point exhausting conservative treatment and in the presence of a neurologic deficit we discussed microlumbar decompression. I discussed the risks and benefits including bleeding, infection, DVT, PE, anesthetic complications, worsening in their symptoms, improvement in their symptoms, C SF leakage, epidural fibrosis, need for future surgeries such as revision discectomy and lumbar fusion. I also indicated that this is an operation to basically decompress the nerve root to allow recovery as opposed to fixing a herniated disc and that the incidence of recurrent chest disc herniation can approach 15%. Also that nerve root recovery is variable and may not recover completely.  I discussed the operative course including overnight in the hospital. Immediate ambulation.  Follow-up in 2 weeks for suture removal. 6 weeks until healing of the herniation followed by 6 weeks of reconditioning and strengthening of the core musculature. Also discussed the need to employ the concepts of disc pressure management and core motion following the surgery to minimize the risk of recurrent disc herniation. We will obtain preoperative clearance i if necessary and proceed accordingly.  We will try one I referral to physical therapy for a McKenzie based session in the  meantime.  Also completed steroid dose pack. Monitor blood glucose.  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  Plan microlumbar decompression L4-5  Cecilie Kicks, PA-C for Dr. Tonita Cong 04/19/2018, 4:19 PM

## 2018-05-01 ENCOUNTER — Other Ambulatory Visit (HOSPITAL_COMMUNITY): Payer: BLUE CROSS/BLUE SHIELD

## 2018-05-09 ENCOUNTER — Encounter (HOSPITAL_COMMUNITY): Admission: RE | Payer: Self-pay | Source: Home / Self Care

## 2018-05-09 ENCOUNTER — Ambulatory Visit (HOSPITAL_COMMUNITY): Admission: RE | Admit: 2018-05-09 | Payer: Medicare Other | Source: Home / Self Care | Admitting: Specialist

## 2018-05-09 SURGERY — LUMBAR LAMINECTOMY/DECOMPRESSION MICRODISCECTOMY 1 LEVEL
Anesthesia: General

## 2018-06-06 ENCOUNTER — Other Ambulatory Visit: Payer: Self-pay | Admitting: Urology

## 2018-06-06 ENCOUNTER — Other Ambulatory Visit (HOSPITAL_COMMUNITY): Payer: Self-pay | Admitting: Urology

## 2018-06-06 DIAGNOSIS — I10 Essential (primary) hypertension: Secondary | ICD-10-CM

## 2018-06-06 DIAGNOSIS — C689 Malignant neoplasm of urinary organ, unspecified: Secondary | ICD-10-CM

## 2018-06-06 DIAGNOSIS — N400 Enlarged prostate without lower urinary tract symptoms: Secondary | ICD-10-CM

## 2018-06-06 DIAGNOSIS — R896 Abnormal cytological findings in specimens from other organs, systems and tissues: Secondary | ICD-10-CM

## 2018-06-12 ENCOUNTER — Ambulatory Visit
Admission: RE | Admit: 2018-06-12 | Discharge: 2018-06-12 | Disposition: A | Discharge status: Home / Self Care | Payer: Medicare Other | Source: Ambulatory Visit | Attending: Urology | Admitting: Urology

## 2018-06-12 ENCOUNTER — Other Ambulatory Visit: Payer: Self-pay

## 2018-06-12 DIAGNOSIS — N400 Enlarged prostate without lower urinary tract symptoms: Secondary | ICD-10-CM | POA: Insufficient documentation

## 2018-06-12 DIAGNOSIS — R896 Abnormal cytological findings in specimens from other organs, systems and tissues: Secondary | ICD-10-CM | POA: Diagnosis present

## 2018-06-12 DIAGNOSIS — I10 Essential (primary) hypertension: Secondary | ICD-10-CM | POA: Diagnosis present

## 2018-06-12 DIAGNOSIS — C689 Malignant neoplasm of urinary organ, unspecified: Secondary | ICD-10-CM | POA: Diagnosis present

## 2018-06-12 LAB — POCT I-STAT CREATININE: Creatinine, Ser: 1 mg/dL (ref 0.61–1.24)

## 2018-06-12 MED ORDER — IOHEXOL 300 MG/ML  SOLN
100.0000 mL | Freq: Once | INTRAMUSCULAR | Status: AC | PRN
  Administered 2018-06-12: 100 mL via INTRAVENOUS

## 2019-05-07 IMAGING — CT CT ABDOMEN AND PELVIS WITHOUT AND WITH CONTRAST
2 of 9 series · 12 of 46 positions shown, 18 images · IV contrast (omnipaque)
Comparison: None.

CLINICAL DATA: Followup bladder carcinoma. Abnormal cytology.
Suspected recurrence.

EXAM:
CT ABDOMEN AND PELVIS WITHOUT AND WITH CONTRAST
TECHNIQUE: Multidetector CT imaging of the abdomen and pelvis was performed
following the standard protocol before and following the bolus
administration of intravenous contrast.
CONTRAST:  100mL OMNIPAQUE IOHEXOL 300 MG/ML  SOLN

[Series 2: abd pelvis pre · axial · non-contrast · 0.84mm/px · z∈[-1616,-1186]mm · 9 of 108 slices shown, 15 images]
[im 11/108  soft-tissue]
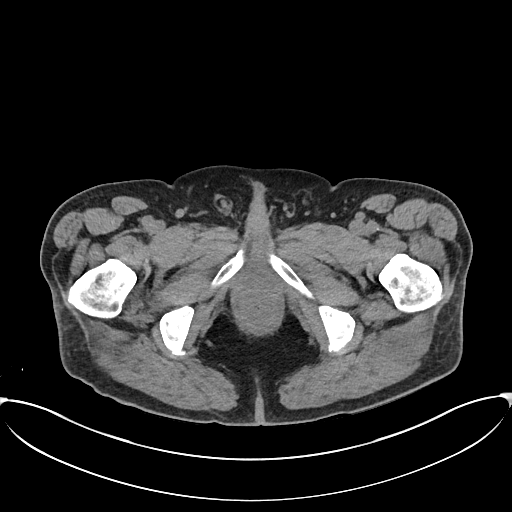
[im 11/108  bone]
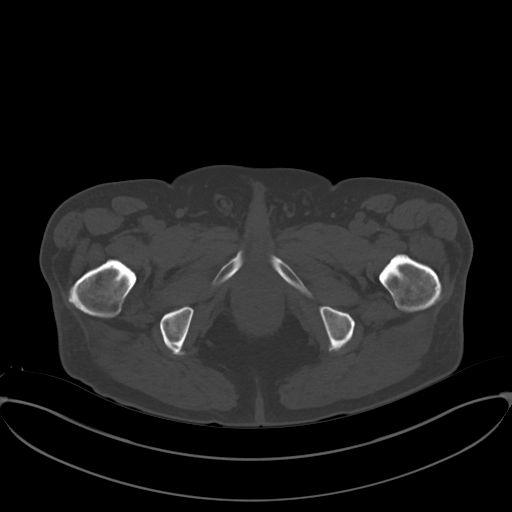
[im 22/108  soft-tissue]
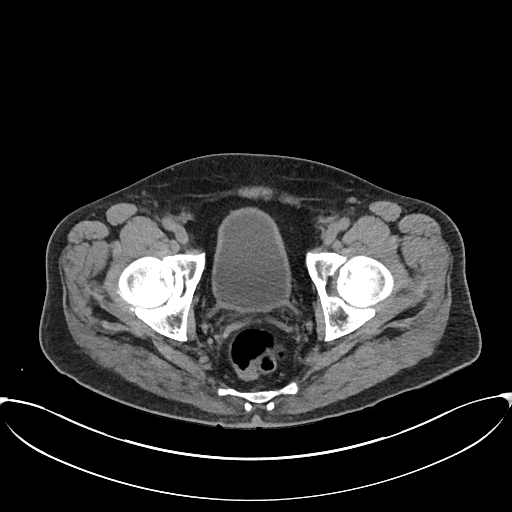
[im 33/108  soft-tissue]
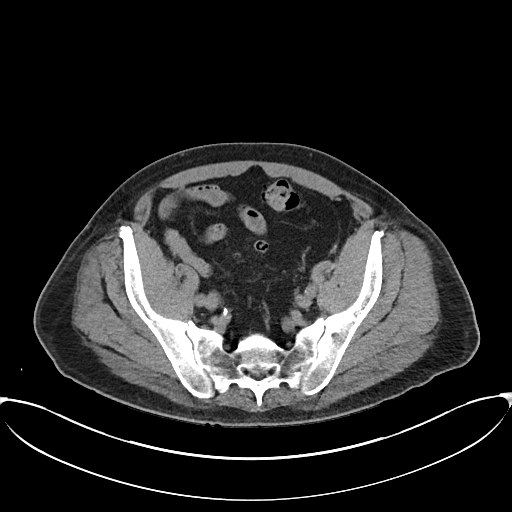
[im 43/108  soft-tissue]
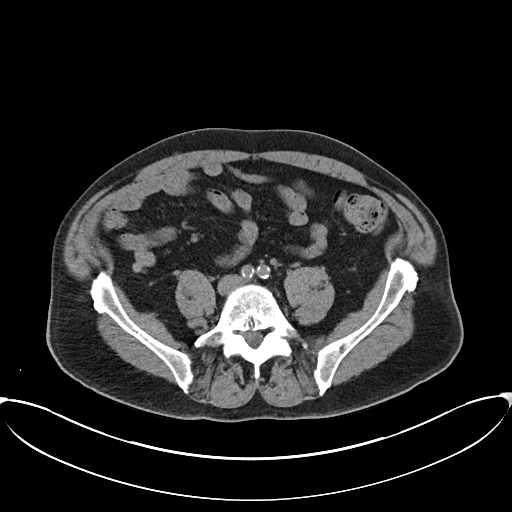
[im 54/108  soft-tissue]
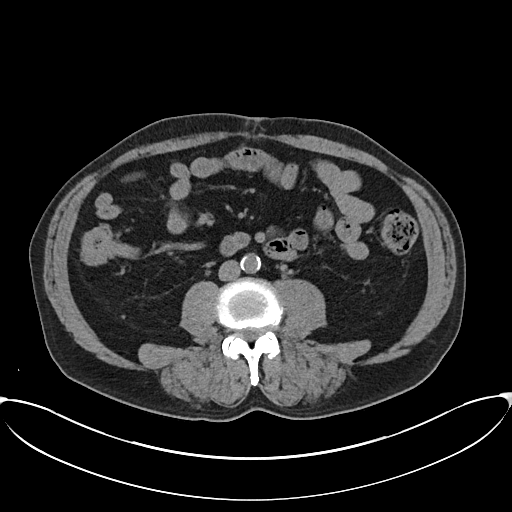
[im 65/108  soft-tissue]
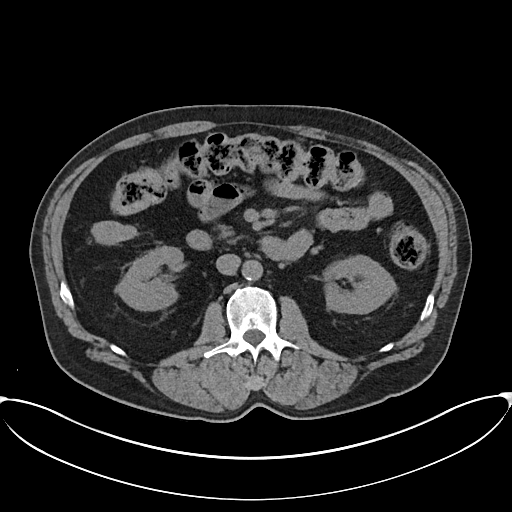
[im 65/108  lung]
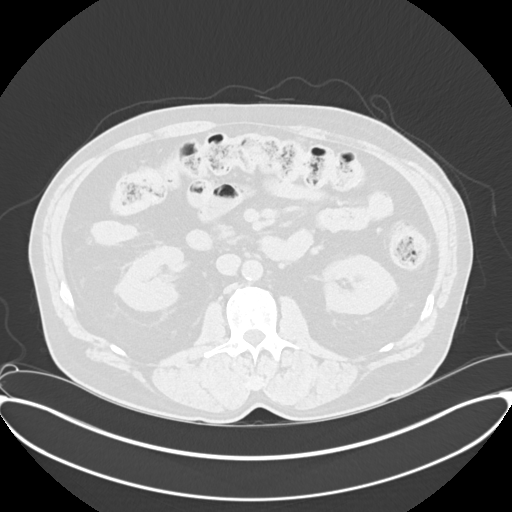
[im 75/108  soft-tissue]
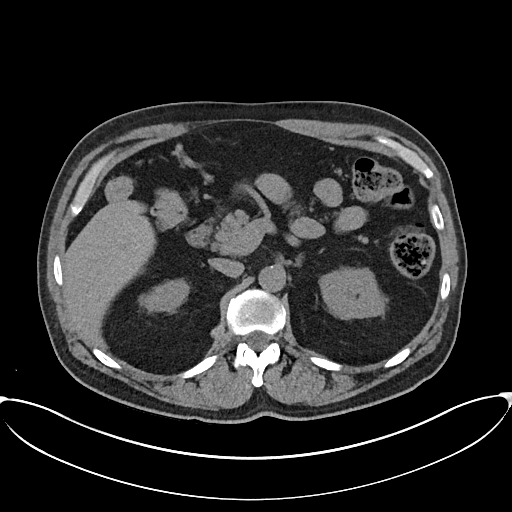
[im 75/108  lung]
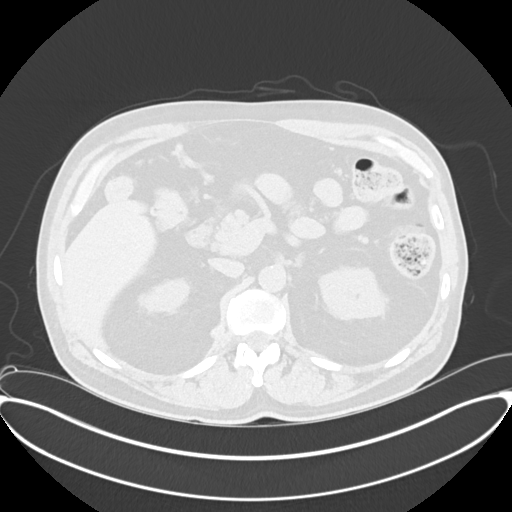
[im 86/108  soft-tissue]
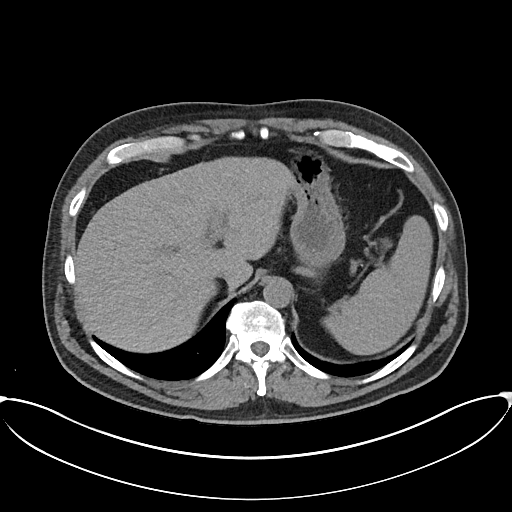
[im 86/108  lung]
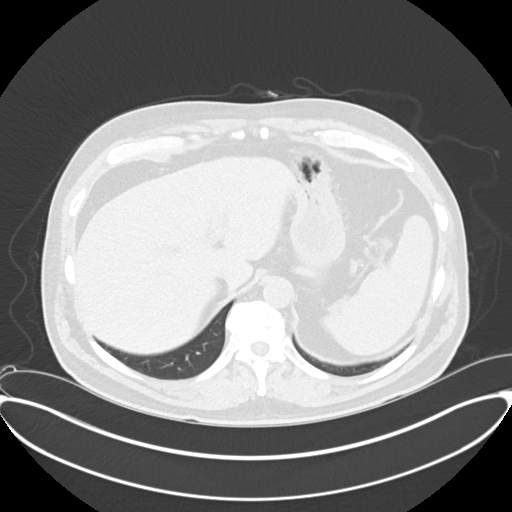
[im 97/108  soft-tissue]
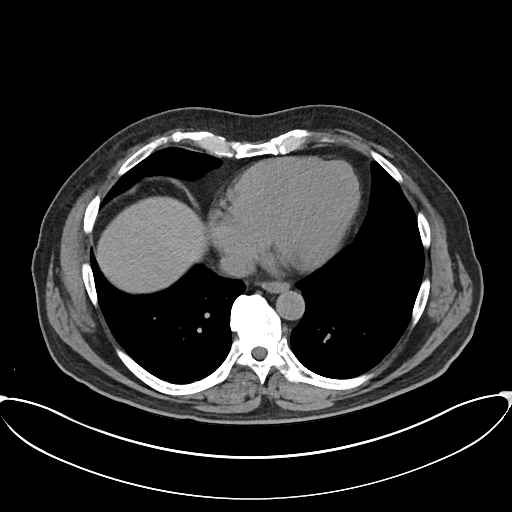
[im 97/108  lung]
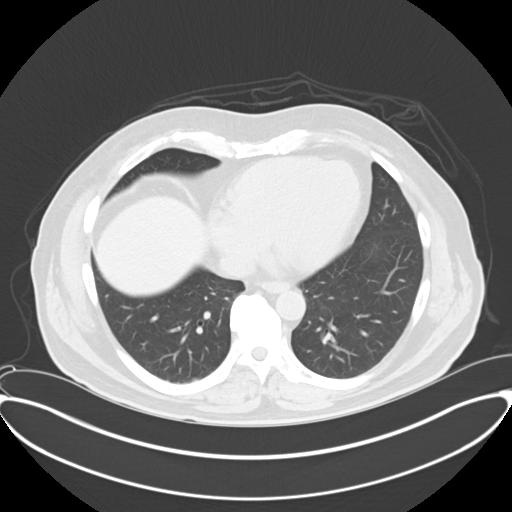
[im 97/108  bone]
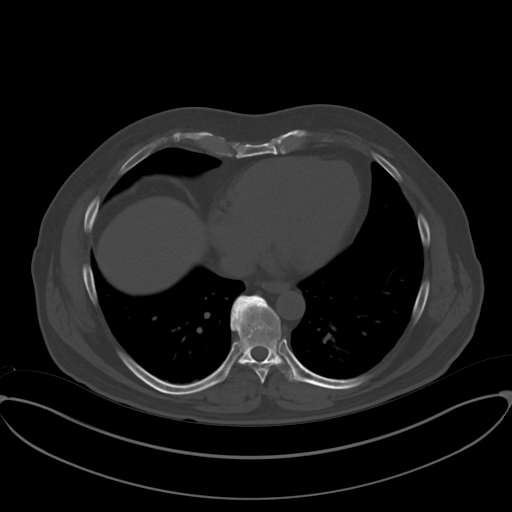

[Series 11: abd pelvis post · coronal · 0.84mm/px · 3 of 213 slices shown]
[im 43/213  soft-tissue]
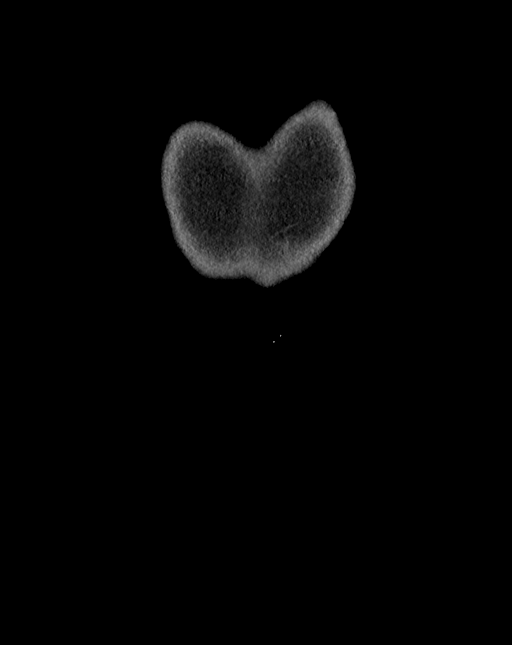
[im 85/213  soft-tissue]
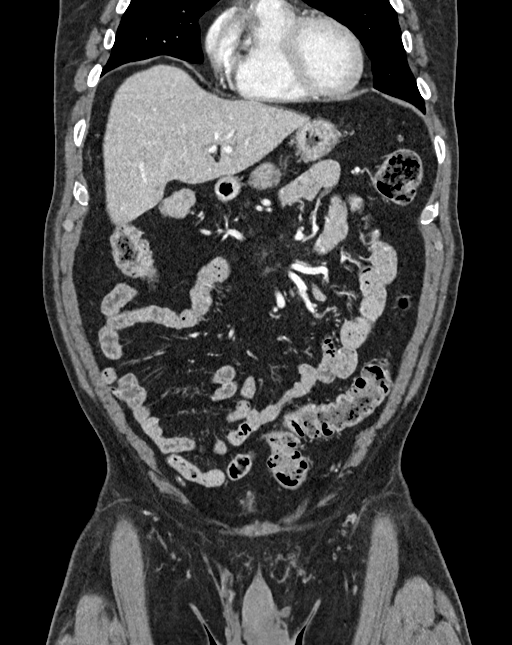
[im 128/213  soft-tissue]
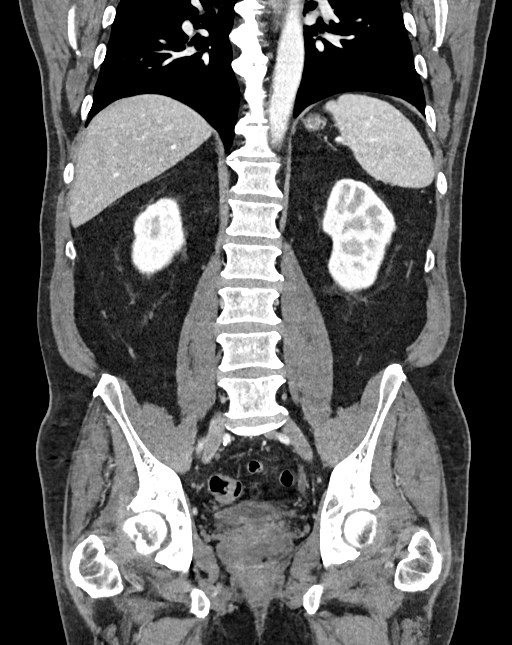

[12 of 46 positions shown; findings below may reference images not displayed]

FINDINGS: Lower Chest: No acute findings.

Hepatobiliary: No hepatic masses identified. Prior cholecystectomy.
No evidence of biliary obstruction.

Pancreas:  No mass or inflammatory changes.

Spleen: Within normal limits in size and appearance.

Adrenals/Urinary Tract: No adrenal masses identified. No evidence of
urolithiasis or hydronephrosis. No complex cystic or solid renal
masses identified. No masses seen involving the ureters or bladder.

Stomach/Bowel: No evidence of obstruction, inflammatory process or
abnormal fluid collections. Normal appendix visualized.
Diverticulosis is seen mainly involving the sigmoid colon, however
there is no evidence of diverticulitis.

Vascular/Lymphatic: No pathologically enlarged lymph nodes. No
abdominal aortic aneurysm. Aortic atherosclerosis.

Reproductive:  Mildly enlarged prostate.

Other:  None.

Musculoskeletal:  No suspicious bone lesions identified.
IMPRESSION: 1. No radiographic evidence of recurrent or metastatic carcinoma
within the abdomen or pelvis.
2. Mildly enlarged prostate.
3. Colonic diverticulosis, without radiographic evidence of
diverticulitis.

Aortic Atherosclerosis ([id number]).

## 2019-05-30 ENCOUNTER — Ambulatory Visit (INDEPENDENT_AMBULATORY_CARE_PROVIDER_SITE_OTHER): Payer: Medicare Other | Admitting: Podiatry

## 2019-05-30 ENCOUNTER — Other Ambulatory Visit: Payer: Self-pay

## 2019-05-30 DIAGNOSIS — Q828 Other specified congenital malformations of skin: Secondary | ICD-10-CM

## 2019-06-03 NOTE — Progress Notes (Signed)
   Subjective: 69 y.o. male presenting to the office today with a chief complaint of painful callus lesions noted to the bilateral feet that have been present for the past few years. Walking and bearing weight increases the pain. He has not had any recent treatment for the symptoms. Patient is here for further evaluation and treatment.   Past Medical History:  Diagnosis Date  . Arthritis   . Cancer ([place]) 2007   basal cell ca on nose....had Mohs done  . Diabetes mellitus without complication ([place])   . Diabetic peripheral neuropathy ([place])   . Hypertension   . Post-operative nausea and vomiting   . Post-operative nausea and vomiting    one time  . Wears glasses      Objective:  Physical Exam General: Alert and oriented x3 in no acute distress  Dermatology: Hyperkeratotic lesion(s) present on the bilateral feet. Pain on palpation with a central nucleated core noted. Skin is warm, dry and supple bilateral lower extremities. Negative for open lesions or macerations.  Vascular: Palpable pedal pulses bilaterally. No edema or erythema noted. Capillary refill within normal limits.  Neurological: Epicritic and protective threshold grossly intact bilaterally.   Musculoskeletal Exam: Pain on palpation at the keratotic lesion(s) noted. Range of motion within normal limits bilateral. Muscle strength 5/5 in all groups bilateral.  Assessment: 1. Porokeratosis bilateral feet    Plan of Care:  1. Patient evaluated 2. Excisional debridement of keratoic lesion(s) using a chisel blade was performed without incident. Salinocaine applied.  3. Dressed area with light dressing. 4. Patient is to return to the clinic PRN.   Edrick Kins, DPM [facility]  Dr. Edrick Kins, [address]                                        [place], [place] [address]                Office [phone]  Fax [phone]

## 2020-09-10 ENCOUNTER — Encounter (HOSPITAL_COMMUNITY): Payer: Self-pay

## 2020-09-10 NOTE — Patient Instructions (Addendum)
DUE TO COVID-19 ONLY ONE VISITOR IS ALLOWED TO COME WITH YOU AND STAY IN THE WAITING ROOM ONLY DURING PRE OP AND PROCEDURE.   **NO VISITORS ARE ALLOWED IN THE SHORT STAY AREA OR RECOVERY ROOM!!**  IF YOU WILL BE ADMITTED INTO THE HOSPITAL YOU ARE ALLOWED ONLY TWO SUPPORT PEOPLE DURING VISITATION HOURS ONLY (10AM -8PM)   The support person(s) may change daily. The support person(s) must pass our screening, gel in and out, and wear a mask at all times, including in the patient's room. Patients must also wear a mask when staff or their support person are in the room.  No visitors under the age of 82. Any visitor under the age of 38 must be accompanied by an adult.    COVID SWAB TESTING MUST BE COMPLETED ON:  Friday, 09-24-20 at 9:00 AM  [place], [place] are not required to quarantine, however you are required to wear a well-fitted mask when you are out and around people not in your household.  Hand Hygiene often Do NOT share personal items Notify your provider if you are in close contact with someone who has COVID or you develop fever 100.4 or greater, new onset of sneezing, cough, sore throat, shortness of breath or body aches.         Your procedure is scheduled on:  Tuesday, 09-28-20   Report to [facility] Main  Entrance    Report to admitting at 6:10 AM   Call this number if you have problems the morning of surgery [phone]   Do not eat food :After Midnight.   May have liquids until  5:30 AM  day of surgery  CLEAR LIQUID DIET  Foods Allowed                                                                     Foods Excluded  Water, Black Coffee and tea, regular and decaf               liquids that you cannot  Plain Jell-O in any flavor  (No red)                                     see through such as: Fruit ices (not with fruit pulp)                                      milk, soups, orange  juice              Iced Popsicles (No red)                                      All solid food                                   Apple juices Sports drinks like Gatorade (No red) Lightly seasoned  clear broth or consume(fat free) Sugar, honey syrup    Complete one G2 drink the morning of surgery at  5:30 AM the day of surgery.     The day of surgery:  Drink ONE (1) Pre-Surgery G2 the morning of surgery. Drink in one sitting. Do not sip.  This drink was given to you during your hospital  pre-op appointment visit. Nothing else to drink after completing the G2.          If you have questions, please contact your surgeon's office.     Oral Hygiene is also important to reduce your risk of infection.                                    Remember - BRUSH YOUR TEETH THE MORNING OF SURGERY WITH YOUR REGULAR TOOTHPASTE   Do NOT smoke after Midnight   Take these medicines the morning of surgery with A SIP OF WATER:  Pravastatin   How to Manage Your Diabetes  Before and After Surgery  Why is it important to control my blood sugar before and after surgery? Improving blood sugar levels before and after surgery helps healing and can limit problems. A way of improving blood sugar control is eating a healthy diet by:  Eating less sugar and carbohydrates  Increasing activity/exercise  Talking with your doctor about reaching your blood sugar goals High blood sugars (greater than 180 mg/dL) can raise your risk of infections and slow your recovery, so you will need to focus on controlling your diabetes during the weeks before surgery. Make sure that the doctor who takes care of your diabetes knows about your planned surgery including the date and location.  How do I manage my blood sugar before surgery? Check your blood sugar at least 4 times a day, starting 2 days before surgery, to make sure that the level is not too high or low. Check your blood sugar the morning of your surgery when you wake up  and every 2 hours until you get to the Short Stay unit. If your blood sugar is less than 70 mg/dL, you will need to treat for low blood sugar: Do not take insulin. Treat a low blood sugar (less than 70 mg/dL) with  cup of clear juice (cranberry or apple), 4 glucose tablets, OR glucose gel. Recheck blood sugar in 15 minutes after treatment (to make sure it is greater than 70 mg/dL). If your blood sugar is not greater than 70 mg/dL on recheck, call [phone] for further instructions. Report your blood sugar to the short stay nurse when you get to Short Stay.  If you are admitted to the hospital after surgery: Your blood sugar will be checked by the staff and you will probably be given insulin after surgery (instead of oral diabetes medicines) to make sure you have good blood sugar levels. The goal for blood sugar control after surgery is 80-180 mg/dL.   WHAT DO I DO ABOUT MY DIABETES MEDICATION?  Do not take oral diabetes medicines (pills) the morning of surgery.  THE DAY BEFORE SURGERY:  Take Glimepiride and Metformin as prescribed.       THE MORNING OF SURGERY:  Do not take Glimepiride and Metformin.    Reviewed and Endorsed by [facility] Patient Education Committee, August 2015  You may not have any metal on your body including  jewelry, and body piercing             Do not wear  lotions, powders, cologne, or deodorant              Men may shave face and neck.   Do not bring valuables to the hospital. [place].   Contacts, dentures or bridgework may not be worn into surgery.   Bring small overnight bag day of surgery.   Special Instructions: Bring a copy of your healthcare power of attorney and living will documents         the day of surgery if you haven't scanned them in before.  Please read over the following fact sheets you were given: IF YOU HAVE QUESTIONS ABOUT YOUR PRE OP INSTRUCTIONS PLEASE CALL  [place] - Preparing for Surgery Before surgery, you can play an important role.  Because skin is not sterile, your skin needs to be as free of germs as possible.  You can reduce the number of germs on your skin by washing with CHG (chlorahexidine gluconate) soap before surgery.  CHG is an antiseptic cleaner which kills germs and bonds with the skin to continue killing germs even after washing. Please DO NOT use if you have an allergy to CHG or antibacterial soaps.  If your skin becomes reddened/irritated stop using the CHG and inform your nurse when you arrive at Short Stay. Do not shave (including legs and underarms) for at least 48 hours prior to the first CHG shower.  You may shave your face/neck.  Please follow these instructions carefully:  1.  Shower with CHG Soap the night before surgery and the  morning of surgery.  2.  If you choose to wash your hair, wash your hair first as usual with your normal  shampoo.  3.  After you shampoo, rinse your hair and body thoroughly to remove the shampoo.                             4.  Use CHG as you would any other liquid soap.  You can apply chg directly to the skin and wash.  Gently with a scrungie or clean washcloth.  5.  Apply the CHG Soap to your body ONLY FROM THE NECK DOWN.   Do   not use on face/ open                           Wound or open sores. Avoid contact with eyes, ears mouth and   genitals (private parts).                       Wash face,  Genitals (private parts) with your normal soap.             6.  Wash thoroughly, paying special attention to the area where your    surgery  will be performed.  7.  Thoroughly rinse your body with warm water from the neck down.  8.  DO NOT shower/wash with your normal soap after using and rinsing off the CHG Soap.                9.  Pat yourself dry with a clean towel.  10.  Wear clean pajamas.            11.  Place clean sheets on your bed the night of your first  shower and do not  sleep with pets. Day of Surgery : Do not apply any lotions/deodorants the morning of surgery.  Please wear clean clothes to the hospital/surgery center.  FAILURE TO FOLLOW THESE INSTRUCTIONS MAY RESULT IN THE CANCELLATION OF YOUR SURGERY  PATIENT SIGNATURE_________________________________  NURSE SIGNATURE__________________________________  ________________________________________________________________________   Adam Phenix  An incentive spirometer is a tool that can help keep your lungs clear and active. This tool measures how well you are filling your lungs with each breath. Taking long deep breaths may help reverse or decrease the chance of developing breathing (pulmonary) problems (especially infection) following: A long period of time when you are unable to move or be active. BEFORE THE PROCEDURE  If the spirometer includes an indicator to show your best effort, your nurse or respiratory therapist will set it to a desired goal. If possible, sit up straight or lean slightly forward. Try not to slouch. Hold the incentive spirometer in an upright position. INSTRUCTIONS FOR USE  Sit on the edge of your bed if possible, or sit up as far as you can in bed or on a chair. Hold the incentive spirometer in an upright position. Breathe out normally. Place the mouthpiece in your mouth and seal your lips tightly around it. Breathe in slowly and as deeply as possible, raising the piston or the ball toward the top of the column. Hold your breath for 3-5 seconds or for as long as possible. Allow the piston or ball to fall to the bottom of the column. Remove the mouthpiece from your mouth and breathe out normally. Rest for a few seconds and repeat Steps 1 through 7 at least 10 times every 1-2 hours when you are awake. Take your time and take a few normal breaths between deep breaths. The spirometer may include an indicator to show your best effort. Use the indicator as a  goal to work toward during each repetition. After each set of 10 deep breaths, practice coughing to be sure your lungs are clear. If you have an incision (the cut made at the time of surgery), support your incision when coughing by placing a pillow or rolled up towels firmly against it. Once you are able to get out of bed, walk around indoors and cough well. You may stop using the incentive spirometer when instructed by your caregiver.  RISKS AND COMPLICATIONS Take your time so you do not get dizzy or light-headed. If you are in pain, you may need to take or ask for pain medication before doing incentive spirometry. It is harder to take a deep breath if you are having pain. AFTER USE Rest and breathe slowly and easily. It can be helpful to keep track of a log of your progress. Your caregiver can provide you with a simple table to help with this. If you are using the spirometer at home, follow these instructions: [place] IF:  You are having difficultly using the spirometer. You have trouble using the spirometer as often as instructed. Your pain medication is not giving enough relief while using the spirometer. You develop fever of 100.5 F (38.1 C) or higher. SEEK IMMEDIATE MEDICAL CARE IF:  You cough up bloody sputum that had not been present before. You develop fever of 102 F (38.9 C) or greater. You develop worsening pain at  or near the incision site. MAKE SURE YOU:  Understand these instructions. Will watch your condition. Will get help right away if you are not doing well or get worse. Document Released: 06/05/2006 Document Revised: 04/17/2011 Document Reviewed: 08/06/2006 Exit[employer] Patient Information 2014 [employer], [place].   ________________________________________________________________________  WHAT IS A BLOOD TRANSFUSION? Blood Transfusion Information  A transfusion is the replacement of blood or some of its parts. Blood is made up of multiple cells which provide  different functions. Red blood cells carry oxygen and are used for blood loss replacement. White blood cells fight against infection. Platelets control bleeding. Plasma helps clot blood. Other blood products are available for specialized needs, such as hemophilia or other clotting disorders. BEFORE THE TRANSFUSION  Who gives blood for transfusions?  Healthy volunteers who are fully evaluated to make sure their blood is safe. This is blood bank blood. Transfusion therapy is the safest it has ever been in the practice of medicine. Before blood is taken from a donor, a complete history is taken to make sure that person has no history of diseases nor engages in risky social behavior (examples are intravenous drug use or sexual activity with multiple partners). The donor's travel history is screened to minimize risk of transmitting infections, such as malaria. The donated blood is tested for signs of infectious diseases, such as HIV and hepatitis. The blood is then tested to be sure it is compatible with you in order to minimize the chance of a transfusion reaction. If you or a relative donates blood, this is often done in anticipation of surgery and is not appropriate for emergency situations. It takes many days to process the donated blood. RISKS AND COMPLICATIONS Although transfusion therapy is very safe and saves many lives, the main dangers of transfusion include:  Getting an infectious disease. Developing a transfusion reaction. This is an allergic reaction to something in the blood you were given. Every precaution is taken to prevent this. The decision to have a blood transfusion has been considered carefully by your caregiver before blood is given. Blood is not given unless the benefits outweigh the risks. AFTER THE TRANSFUSION Right after receiving a blood transfusion, you will usually feel much better and more energetic. This is especially true if your red blood cells have gotten low (anemic).  The transfusion raises the level of the red blood cells which carry oxygen, and this usually causes an energy increase. The nurse administering the transfusion will monitor you carefully for complications. HOME CARE INSTRUCTIONS  No special instructions are needed after a transfusion. You may find your energy is better. Speak with your caregiver about any limitations on activity for underlying diseases you may have. SEEK MEDICAL CARE IF:  Your condition is not improving after your transfusion. You develop redness or irritation at the intravenous (IV) site. SEEK IMMEDIATE MEDICAL CARE IF:  Any of the following symptoms occur over the next 12 hours: Shaking chills. You have a temperature by mouth above 102 F (38.9 C), not controlled by medicine. Chest, back, or muscle pain. People around you feel you are not acting correctly or are confused. Shortness of breath or difficulty breathing. Dizziness and fainting. You get a rash or develop hives. You have a decrease in urine output. Your urine turns a dark color or changes to pink, red, or brown. Any of the following symptoms occur over the next 10 days: You have a temperature by mouth above 102 F (38.9 C), not controlled by medicine. Shortness of breath. Weakness  after normal activity. The white part of the eye turns yellow (jaundice). You have a decrease in the amount of urine or are urinating less often. Your urine turns a dark color or changes to pink, red, or brown. Document Released: 01/21/2000 Document Revised: 04/17/2011 Document Reviewed: 09/09/2007 [facility] Patient Information 2014 [place], [place].  _______________________________________________________________________

## 2020-09-10 NOTE — Progress Notes (Addendum)
COVID Vaccine Completed: Yes x2 Date COVID Vaccine completed:  04-25-19, 05-23-19 Has received booster: COVID vaccine manufacturer:   Moderna     Date of COVID positive in last 90 days:  No  PCP - Frazier Richards, MD Cardiologist - N/A  Chest x-ray - N/A EKG - 09-16-20 Epic Stress Test - greater than 5 years ECHO - N/A Cardiac Cath - N/A Pacemaker/ICD device last checked: Spinal Cord Stimulator:  Sleep Study - N/A CPAP -   Fasting Blood Sugar - 100 to 117 Checks Blood Sugar- 3 times  a week   Blood Thinner Instructions: Aspirin Instructions:  ASA 81 mg.  To take lat dose on 08-19-20 Last Dose:  Activity level:  Can go up a flight of stairs and perform activities of daily living without stopping and without symptoms of chest pain or shortness of breath.      Anesthesia review:  N/A  Patient denies shortness of breath, fever, cough and chest pain at PAT appointment   Patient verbalized understanding of instructions that were given to them at the PAT appointment. Patient was also instructed that they will need to review over the PAT instructions again at home before surgery.

## 2020-09-16 ENCOUNTER — Encounter (HOSPITAL_COMMUNITY): Payer: Self-pay

## 2020-09-16 ENCOUNTER — Encounter (HOSPITAL_COMMUNITY)
Admission: RE | Admit: 2020-09-16 | Discharge: 2020-09-16 | Disposition: A | Discharge status: Home / Self Care | Payer: Medicare Other | Source: Ambulatory Visit | Attending: Orthopedic Surgery | Admitting: Orthopedic Surgery

## 2020-09-16 ENCOUNTER — Other Ambulatory Visit: Payer: Self-pay

## 2020-09-16 DIAGNOSIS — Z01818 Encounter for other preprocedural examination: Secondary | ICD-10-CM | POA: Diagnosis not present

## 2020-09-16 HISTORY — DX: Foot drop, right foot: M21.371

## 2020-09-16 HISTORY — DX: Hyperlipidemia, unspecified: E78.5

## 2020-09-16 HISTORY — DX: Fatty (change of) liver, not elsewhere classified: K76.0

## 2020-09-16 HISTORY — DX: Spinal stenosis, lumbar region without neurogenic claudication: M48.061

## 2020-09-16 HISTORY — DX: Malignant neoplasm of bladder, unspecified: C67.9

## 2020-09-16 LAB — CBC
HCT: 46.7 % (ref 39.0–52.0)
Hemoglobin: 16 g/dL (ref 13.0–17.0)
MCH: 30.9 pg (ref 26.0–34.0)
MCHC: 34.3 g/dL (ref 30.0–36.0)
MCV: 90.2 fL (ref 80.0–100.0)
Platelets: 190 10*3/uL (ref 150–400)
RBC: 5.18 MIL/uL (ref 4.22–5.81)
RDW: 13.1 % (ref 11.5–15.5)
WBC: 5.4 10*3/uL (ref 4.0–10.5)
nRBC: 0 % (ref 0.0–0.2)

## 2020-09-16 LAB — COMPREHENSIVE METABOLIC PANEL
ALT: 32 U/L (ref 0–44)
AST: 26 U/L (ref 15–41)
Albumin: 4.7 g/dL (ref 3.5–5.0)
Alkaline Phosphatase: 60 U/L (ref 38–126)
Anion gap: 8 (ref 5–15)
BUN: 17 mg/dL (ref 8–23)
CO2: 29 mmol/L (ref 22–32)
Calcium: 9.8 mg/dL (ref 8.9–10.3)
Chloride: 103 mmol/L (ref 98–111)
Creatinine, Ser: 1.12 mg/dL (ref 0.61–1.24)
GFR, Estimated: >60 mL/min (ref >60)
Glucose, Bld: 122 mg/dL — ABNORMAL HIGH (ref 70–99)
Potassium: 4.4 mmol/L (ref 3.5–5.1)
Sodium: 140 mmol/L (ref 135–145)
Total Bilirubin: 0.9 mg/dL (ref 0.3–1.2)
Total Protein: 7.5 g/dL (ref 6.5–8.1)

## 2020-09-16 LAB — TYPE AND SCREEN
ABO/RH(D): O NEG
Antibody Screen: NEGATIVE

## 2020-09-16 LAB — SURGICAL PCR SCREEN
MRSA, PCR: NEGATIVE
Staphylococcus aureus: POSITIVE — AB

## 2020-09-16 LAB — GLUCOSE, CAPILLARY: Glucose-Capillary: 129 mg/dL — ABNORMAL HIGH (ref 70–99)

## 2020-09-16 LAB — HEMOGLOBIN A1C
Hgb A1c MFr Bld: 6.1 % — ABNORMAL HIGH (ref 4.8–5.6)
Mean Plasma Glucose: 128.37 mg/dL

## 2020-09-16 NOTE — Progress Notes (Signed)
PCR results sent to Dr. Olin to review.   

## 2020-09-24 ENCOUNTER — Other Ambulatory Visit
Admission: RE | Admit: 2020-09-24 | Discharge: 2020-09-24 | Disposition: A | Discharge status: Home / Self Care | Payer: Medicare Other | Source: Ambulatory Visit | Attending: Orthopedic Surgery | Admitting: Orthopedic Surgery

## 2020-09-24 ENCOUNTER — Other Ambulatory Visit: Payer: Self-pay

## 2020-09-24 DIAGNOSIS — Z01812 Encounter for preprocedural laboratory examination: Secondary | ICD-10-CM | POA: Diagnosis present

## 2020-09-24 DIAGNOSIS — Z20822 Contact with and (suspected) exposure to covid-19: Secondary | ICD-10-CM | POA: Diagnosis not present

## 2020-09-24 LAB — SARS CORONAVIRUS 2 (TAT 6-24 HRS): SARS Coronavirus 2: NEGATIVE

## 2020-09-27 NOTE — H&P (Signed)
TOTAL HIP ADMISSION H&P  Patient is admitted for right total hip arthroplasty.  Subjective:  Chief Complaint: right hip pain  HPI: [name], 70 y.o. male, has a history of pain and functional disability in the right hip(s) due to arthritis and patient has failed non-surgical conservative treatments for greater than 12 weeks to include NSAID's and/or analgesics and activity modification.  Onset of symptoms was gradual starting 2 years ago with gradually worsening course since that time.The patient noted no past surgery on the right hip(s).  Patient currently rates pain in the right hip at 8 out of 10 with activity. Patient has worsening of pain with activity and weight bearing, pain that interfers with activities of daily living, and pain with passive range of motion. Patient has evidence of joint space narrowing by imaging studies. This condition presents safety issues increasing the risk of falls. There is no current active infection.  Patient Active Problem List   Diagnosis Date Noted   History of bladder cancer 03/05/2017   Pain in left knee 02/21/2017   Healthcare maintenance 06/16/2015   Acquired trigger finger 01/18/2015   DMII (diabetes mellitus, type 2) ([place]) 07/20/2013   Fatty liver disease, nonalcoholic [id number]   HTN (hypertension), benign 07/20/2013   Hyperlipidemia 07/20/2013   Past Medical History:  Diagnosis Date   Arthritis    Bladder cancer ([place])    Cancer ([place]) 2007   basal cell ca on nose....had Mohs done   Diabetes mellitus without complication ([facility])    Diabetic peripheral neuropathy ([facility])    Fatty liver disease, nonalcoholic    Foot drop, right foot    Hyperlipidemia    Hypertension    Lumbar stenosis    Post-operative nausea and vomiting    Post-operative nausea and vomiting    one time   Wears glasses     Past Surgical History:  Procedure Laterality Date   BASAL CELL CARCINOMA EXCISION     on nose   CARPAL TUNNEL RELEASE Bilateral     CHOLECYSTECTOMY  2004   lap choli   COLONOSCOPY     GASTROC RECESSION EXTREMITY Right 11/20/2013   Procedure: GASTROC RECESSION EXTREMITY;  Surgeon: Wylene Simmer, MD;  Location: [facility];  Service: Orthopedics;  Laterality: Right;   HERNIA REPAIR  2009   umb   KNEE ARTHROSCOPY Left    SHOULDER ARTHROSCOPY  2012   right   TENDON REPAIR Right 11/20/2013   Procedure: RIGHT TIBIALIS ANTERIOR RECONSTRUCTION GASTROC RECESSION ;  Surgeon: Wylene Simmer, MD;  Location: [facility];  Service: Orthopedics;  Laterality: Right;   TRANSURETHRAL RESECTION OF BLADDER TUMOR N/A 10/31/2016   Procedure: TRANSURETHRAL RESECTION OF BLADDER TUMOR (TURBT);  Surgeon: Royston Cowper, MD;  Location: [facility];  Service: Urology;  Laterality: N/A;    No current facility-administered medications for this encounter.   Current Outpatient Medications  Medication Sig Dispense Refill Last Dose   acetaminophen (TYLENOL) 650 MG CR tablet Take 650-1,300 mg by mouth 2 (two) times daily as needed (hip pain.).      aspirin EC 81 MG tablet Take 81 mg by mouth in the morning.      CINNAMON PO Take 1,000 mg by mouth in the morning.      glimepiride (AMARYL) 1 MG tablet Take 1 mg by mouth in the morning.      lisinopril (PRINIVIL,ZESTRIL) 10 MG tablet Take 10 mg by mouth in the morning.      meloxicam (MOBIC) 15  MG tablet Take 15 mg by mouth daily as needed for pain.      metFORMIN (GLUCOPHAGE-XR) 500 MG 24 hr tablet Take 2,000 mg by mouth daily with breakfast.      Multiple Vitamins-Minerals (MULTIVITAMIN WITH MINERALS) tablet Take 1 tablet by mouth in the morning.      Omega-3 Fatty Acids (FISH OIL) 1000 MG CAPS Take 1,000 mg by mouth in the morning.      pravastatin (PRAVACHOL) 80 MG tablet Take 80 mg by mouth in the morning.      No Known Allergies  Social History   Tobacco Use   Smoking status: Never   Smokeless tobacco: Never  Substance Use Topics   Alcohol use: No    Alcohol/week: 0.0  standard drinks    Family History  Problem Relation Age of Onset   Diabetes Mother    Alzheimer's disease Mother    Diabetes Brother      Review of Systems  Constitutional:  Negative for chills and fever.  Respiratory:  Negative for cough and shortness of breath.   Cardiovascular:  Negative for chest pain.  Gastrointestinal:  Negative for nausea and vomiting.  Musculoskeletal:  Positive for arthralgias.   Objective:  Physical Exam Well nourished and well developed. General: Alert and oriented x3, cooperative and pleasant, no acute distress. Head: normocephalic, atraumatic, neck supple. Eyes: EOMI.  Musculoskeletal: Right hip exam: Mild tenderness around the hip Limited active hip flexion with slight external rotation contracture Pain with passive hip flexion internal rotation to 5 to 10 degrees external rotation to 20 degrees with tightness laterally Neurovascular intact distally Left hip exam reveals similar loss of hip flexion internal rotation with external rotation at 30 degrees.   Calves soft and nontender. Motor function intact in LE. Strength 5/5 LE bilaterally. Neuro: Distal pulses 2+. Sensation to light touch intact in LE.  Vital signs in last 24 hours:    Labs:   Estimated body mass index is 26.58 kg/m as calculated from the following:   Height as of 09/16/20: '5\' 9"'$  (1.753 m).   Weight as of 09/16/20: 81.6 kg.   Imaging Review Plain radiographs demonstrate severe degenerative joint disease of the right hip(s). The bone quality appears to be adequate for age and reported activity level.      Assessment/Plan:  End stage arthritis, right hip(s)  The patient history, physical examination, clinical judgement of the provider and imaging studies are consistent with end stage degenerative joint disease of the right hip(s) and total hip arthroplasty is deemed medically necessary. The treatment options including medical management, injection therapy,  arthroscopy and arthroplasty were discussed at length. The risks and benefits of total hip arthroplasty were presented and reviewed. The risks due to aseptic loosening, infection, stiffness, dislocation/subluxation,  thromboembolic complications and other imponderables were discussed.  The patient acknowledged the explanation, agreed to proceed with the plan and consent was signed. Patient is being admitted for inpatient treatment for surgery, pain control, PT, OT, prophylactic antibiotics, VTE prophylaxis, progressive ambulation and ADL's and discharge planning.The patient is planning to be discharged  home.  Therapy Plans: HEP Disposition: Home with wife Planned DVT Prophylaxis: aspirin '81mg'$  BID DME needed: none PCP: Dr. Ouida Sills - Per his notes "Stable and no contraindications to hip surgery. Can proceed without further testing." TXA: IV Allergies: NKDA Anesthesia Concerns: none BMI: 28 Last HgbA1c: 6.1%  Other: - Hydrocodone (30) , celebrex, tylenol, robaxin - Hx of bladder tumor several years ago   Griffith Citron, [place]  Orthopedic Surgery [facility] [place] [phone]

## 2020-09-28 ENCOUNTER — Encounter (HOSPITAL_COMMUNITY): Payer: Self-pay | Admitting: Orthopedic Surgery

## 2020-09-28 ENCOUNTER — Ambulatory Visit (HOSPITAL_COMMUNITY): Payer: Medicare Other | Admitting: Certified Registered"

## 2020-09-28 ENCOUNTER — Observation Stay (HOSPITAL_COMMUNITY)
Admission: RE | Admit: 2020-09-28 | Discharge: 2020-09-29 | Disposition: A | Discharge status: Home / Self Care | Payer: Medicare Other | Attending: Orthopedic Surgery | Admitting: Orthopedic Surgery

## 2020-09-28 ENCOUNTER — Encounter (HOSPITAL_COMMUNITY)
Admission: RE | Disposition: A | Discharge status: Home / Self Care | Payer: Self-pay | Source: Home / Self Care | Attending: Orthopedic Surgery

## 2020-09-28 ENCOUNTER — Ambulatory Visit (HOSPITAL_COMMUNITY): Payer: Medicare Other

## 2020-09-28 ENCOUNTER — Other Ambulatory Visit: Payer: Self-pay

## 2020-09-28 ENCOUNTER — Observation Stay (HOSPITAL_COMMUNITY): Payer: Medicare Other

## 2020-09-28 DIAGNOSIS — Z7984 Long term (current) use of oral hypoglycemic drugs: Secondary | ICD-10-CM | POA: Diagnosis not present

## 2020-09-28 DIAGNOSIS — I10 Essential (primary) hypertension: Secondary | ICD-10-CM | POA: Diagnosis not present

## 2020-09-28 DIAGNOSIS — Z79899 Other long term (current) drug therapy: Secondary | ICD-10-CM | POA: Diagnosis not present

## 2020-09-28 DIAGNOSIS — M1611 Unilateral primary osteoarthritis, right hip: Secondary | ICD-10-CM | POA: Diagnosis present

## 2020-09-28 DIAGNOSIS — S72041A Displaced fracture of base of neck of right femur, initial encounter for closed fracture: Secondary | ICD-10-CM

## 2020-09-28 DIAGNOSIS — Z8551 Personal history of malignant neoplasm of bladder: Secondary | ICD-10-CM | POA: Diagnosis not present

## 2020-09-28 DIAGNOSIS — Z85828 Personal history of other malignant neoplasm of skin: Secondary | ICD-10-CM | POA: Insufficient documentation

## 2020-09-28 DIAGNOSIS — E1140 Type 2 diabetes mellitus with diabetic neuropathy, unspecified: Secondary | ICD-10-CM | POA: Insufficient documentation

## 2020-09-28 DIAGNOSIS — Z7982 Long term (current) use of aspirin: Secondary | ICD-10-CM | POA: Insufficient documentation

## 2020-09-28 DIAGNOSIS — Z96649 Presence of unspecified artificial hip joint: Secondary | ICD-10-CM

## 2020-09-28 HISTORY — PX: TOTAL HIP ARTHROPLASTY: SHX124

## 2020-09-28 LAB — ABO/RH: ABO/RH(D): O NEG

## 2020-09-28 LAB — GLUCOSE, CAPILLARY
Glucose-Capillary: 107 mg/dL — ABNORMAL HIGH (ref 70–99)
Glucose-Capillary: 180 mg/dL — ABNORMAL HIGH (ref 70–99)
Glucose-Capillary: 264 mg/dL — ABNORMAL HIGH (ref 70–99)
Glucose-Capillary: 189 mg/dL — ABNORMAL HIGH (ref 70–99)

## 2020-09-28 SURGERY — ARTHROPLASTY, HIP, TOTAL, ANTERIOR APPROACH
Anesthesia: Monitor Anesthesia Care | Site: Hip | Laterality: Right

## 2020-09-28 MED ORDER — HYDROCODONE-ACETAMINOPHEN 7.5-325 MG PO TABS: 1.0000 | ORAL_TABLET | ORAL | Status: DC | PRN

## 2020-09-28 MED ORDER — GLIMEPIRIDE 1 MG PO TABS
1.0000 mg | ORAL_TABLET | Freq: Every morning | ORAL | Status: DC
  Administered 2020-09-29: 1 mg via ORAL
  Filled 2020-09-28: qty 1

## 2020-09-28 MED ORDER — DIPHENHYDRAMINE HCL 12.5 MG/5ML PO ELIX: 12.5000 mg | ORAL_SOLUTION | ORAL | Status: DC | PRN

## 2020-09-28 MED ORDER — FERROUS SULFATE 325 (65 FE) MG PO TABS
325.0000 mg | ORAL_TABLET | Freq: Three times a day (TID) | ORAL | Status: DC
  Administered 2020-09-28 – 2020-09-29 (×2): 325 mg via ORAL
  Filled 2020-09-28 (×2): qty 1

## 2020-09-28 MED ORDER — DEXAMETHASONE SODIUM PHOSPHATE 10 MG/ML IJ SOLN
8.0000 mg | Freq: Once | INTRAMUSCULAR | Status: AC
  Administered 2020-09-28: 4 mg via INTRAVENOUS

## 2020-09-28 MED ORDER — CEFAZOLIN SODIUM-DEXTROSE 2-4 GM/100ML-% IV SOLN
2.0000 g | Freq: Four times a day (QID) | INTRAVENOUS | Status: AC
Start: 2020-09-28 — End: 2020-09-28
  Administered 2020-09-28 (×2): 2 g via INTRAVENOUS
  Filled 2020-09-28 (×2): qty 100

## 2020-09-28 MED ORDER — ONDANSETRON HCL 4 MG/2ML IJ SOLN
4.0000 mg | Freq: Four times a day (QID) | INTRAMUSCULAR | Status: DC | PRN
  Filled 2020-09-28: qty 2

## 2020-09-28 MED ORDER — LABETALOL HCL 5 MG/ML IV SOLN
INTRAVENOUS | Status: DC | PRN
  Administered 2020-09-28: 2.5 mg via INTRAVENOUS

## 2020-09-28 MED ORDER — LACTATED RINGERS IV SOLN: INTRAVENOUS | Status: DC

## 2020-09-28 MED ORDER — PROPOFOL 10 MG/ML IV BOLUS
INTRAVENOUS | Status: DC | PRN
  Administered 2020-09-28: 40 mg via INTRAVENOUS
  Administered 2020-09-28: 160 mg via INTRAVENOUS
  Administered 2020-09-28: 20 mg via INTRAVENOUS

## 2020-09-28 MED ORDER — PROPOFOL 1000 MG/100ML IV EMUL
INTRAVENOUS | Status: AC
  Filled 2020-09-28: qty 100

## 2020-09-28 MED ORDER — PHENOL 1.4 % MT LIQD: 1.0000 | OROMUCOSAL | Status: DC | PRN

## 2020-09-28 MED ORDER — LIDOCAINE 2% (20 MG/ML) 5 ML SYRINGE
INTRAMUSCULAR | Status: DC | PRN
  Administered 2020-09-28: 40 mg via INTRAVENOUS

## 2020-09-28 MED ORDER — MIDAZOLAM HCL 2 MG/2ML IJ SOLN
INTRAMUSCULAR | Status: AC
  Filled 2020-09-28: qty 2

## 2020-09-28 MED ORDER — PRAVASTATIN SODIUM 20 MG PO TABS
80.0000 mg | ORAL_TABLET | Freq: Every morning | ORAL | Status: DC
  Administered 2020-09-29: 80 mg via ORAL
  Filled 2020-09-28: qty 4

## 2020-09-28 MED ORDER — CEFAZOLIN SODIUM-DEXTROSE 2-4 GM/100ML-% IV SOLN
2.0000 g | INTRAVENOUS | Status: AC
  Administered 2020-09-28: 2 g via INTRAVENOUS
  Filled 2020-09-28: qty 100

## 2020-09-28 MED ORDER — TRANEXAMIC ACID-NACL 1000-0.7 MG/100ML-% IV SOLN
1000.0000 mg | Freq: Once | INTRAVENOUS | Status: AC
  Administered 2020-09-28: 1000 mg via INTRAVENOUS
  Filled 2020-09-28: qty 100

## 2020-09-28 MED ORDER — KETAMINE HCL 10 MG/ML IJ SOLN
INTRAMUSCULAR | Status: AC
  Filled 2020-09-28: qty 1

## 2020-09-28 MED ORDER — FENTANYL CITRATE (PF) 100 MCG/2ML IJ SOLN
INTRAMUSCULAR | Status: AC
  Administered 2020-09-28: 50 ug via INTRAVENOUS
  Filled 2020-09-28: qty 2

## 2020-09-28 MED ORDER — KETOROLAC TROMETHAMINE 30 MG/ML IJ SOLN
INTRAMUSCULAR | Status: DC | PRN
  Administered 2020-09-28: 30 mg via INTRAVENOUS

## 2020-09-28 MED ORDER — POVIDONE-IODINE 10 % EX SWAB
2.0000 "application " | Freq: Once | CUTANEOUS | Status: AC
  Administered 2020-09-28: 2 via TOPICAL

## 2020-09-28 MED ORDER — DOCUSATE SODIUM 100 MG PO CAPS
100.0000 mg | ORAL_CAPSULE | Freq: Two times a day (BID) | ORAL | Status: DC
  Administered 2020-09-28 – 2020-09-29 (×2): 100 mg via ORAL
  Filled 2020-09-28 (×2): qty 1

## 2020-09-28 MED ORDER — HYDROCODONE-ACETAMINOPHEN 5-325 MG PO TABS
1.0000 | ORAL_TABLET | ORAL | Status: DC | PRN
  Administered 2020-09-28 (×2): 2 via ORAL
  Administered 2020-09-29: 1 via ORAL
  Administered 2020-09-29: 2 via ORAL
  Filled 2020-09-28 (×2): qty 2
  Filled 2020-09-28: qty 1
  Filled 2020-09-28: qty 2

## 2020-09-28 MED ORDER — INSULIN ASPART 100 UNIT/ML IJ SOLN
0.0000 [IU] | Freq: Three times a day (TID) | INTRAMUSCULAR | Status: DC
  Administered 2020-09-28: 8 [IU] via SUBCUTANEOUS
  Administered 2020-09-29: 3 [IU] via SUBCUTANEOUS
  Administered 2020-09-29: 2 [IU] via SUBCUTANEOUS

## 2020-09-28 MED ORDER — OXYCODONE HCL 5 MG PO TABS
ORAL_TABLET | ORAL | Status: AC
  Administered 2020-09-28: 5 mg via ORAL
  Filled 2020-09-28: qty 1

## 2020-09-28 MED ORDER — INSULIN ASPART 100 UNIT/ML IJ SOLN: 0.0000 [IU] | Freq: Every day | INTRAMUSCULAR | Status: DC

## 2020-09-28 MED ORDER — SODIUM CHLORIDE 0.9 % IR SOLN
Status: DC | PRN
  Administered 2020-09-28: 1000 mL

## 2020-09-28 MED ORDER — DEXAMETHASONE SODIUM PHOSPHATE 10 MG/ML IJ SOLN: 10.0000 mg | Freq: Once | INTRAMUSCULAR | Status: DC

## 2020-09-28 MED ORDER — FENTANYL CITRATE (PF) 100 MCG/2ML IJ SOLN
INTRAMUSCULAR | Status: AC
  Filled 2020-09-28: qty 2

## 2020-09-28 MED ORDER — ACETAMINOPHEN 10 MG/ML IV SOLN
INTRAVENOUS | Status: AC
  Filled 2020-09-28: qty 100

## 2020-09-28 MED ORDER — ACETAMINOPHEN 325 MG PO TABS: 325.0000 mg | ORAL_TABLET | Freq: Four times a day (QID) | ORAL | Status: DC | PRN

## 2020-09-28 MED ORDER — BUPIVACAINE IN DEXTROSE 0.75-8.25 % IT SOLN
INTRATHECAL | Status: DC | PRN
  Administered 2020-09-28: 2 mL via INTRATHECAL

## 2020-09-28 MED ORDER — ASPIRIN 81 MG PO CHEW
81.0000 mg | CHEWABLE_TABLET | Freq: Two times a day (BID) | ORAL | Status: DC
  Administered 2020-09-28 – 2020-09-29 (×2): 81 mg via ORAL
  Filled 2020-09-28 (×2): qty 1

## 2020-09-28 MED ORDER — PROPOFOL 500 MG/50ML IV EMUL
INTRAVENOUS | Status: DC | PRN
  Administered 2020-09-28: 75 ug/kg/min via INTRAVENOUS

## 2020-09-28 MED ORDER — PHENYLEPHRINE HCL-NACL 20-0.9 MG/250ML-% IV SOLN
INTRAVENOUS | Status: DC | PRN
  Administered 2020-09-28: 20 ug/min via INTRAVENOUS

## 2020-09-28 MED ORDER — LIDOCAINE 2% (20 MG/ML) 5 ML SYRINGE
INTRAMUSCULAR | Status: AC
  Filled 2020-09-28: qty 5

## 2020-09-28 MED ORDER — FENTANYL CITRATE (PF) 100 MCG/2ML IJ SOLN
INTRAMUSCULAR | Status: DC | PRN
  Administered 2020-09-28: 50 ug via INTRAVENOUS
  Administered 2020-09-28 (×2): 25 ug via INTRAVENOUS
  Administered 2020-09-28: 50 ug via INTRAVENOUS

## 2020-09-28 MED ORDER — ONDANSETRON HCL 4 MG PO TABS: 4.0000 mg | ORAL_TABLET | Freq: Four times a day (QID) | ORAL | Status: DC | PRN

## 2020-09-28 MED ORDER — CELECOXIB 200 MG PO CAPS
200.0000 mg | ORAL_CAPSULE | Freq: Two times a day (BID) | ORAL | Status: DC
  Administered 2020-09-28 – 2020-09-29 (×3): 200 mg via ORAL
  Filled 2020-09-28 (×3): qty 1

## 2020-09-28 MED ORDER — MIDAZOLAM HCL 2 MG/2ML IJ SOLN
INTRAMUSCULAR | Status: DC | PRN
  Administered 2020-09-28: 2 mg via INTRAVENOUS

## 2020-09-28 MED ORDER — SODIUM CHLORIDE 0.9 % IV SOLN: INTRAVENOUS | Status: DC

## 2020-09-28 MED ORDER — OXYCODONE HCL 5 MG/5ML PO SOLN
5.0000 mg | Freq: Once | ORAL | Status: AC | PRN
Start: 2020-09-28 — End: 2020-09-28

## 2020-09-28 MED ORDER — METHOCARBAMOL 500 MG IVPB - SIMPLE MED
INTRAVENOUS | Status: AC
  Filled 2020-09-28: qty 50

## 2020-09-28 MED ORDER — EPHEDRINE SULFATE-NACL 50-0.9 MG/10ML-% IV SOSY
PREFILLED_SYRINGE | INTRAVENOUS | Status: DC | PRN
  Administered 2020-09-28 (×4): 10 mg via INTRAVENOUS

## 2020-09-28 MED ORDER — TRANEXAMIC ACID-NACL 1000-0.7 MG/100ML-% IV SOLN
1000.0000 mg | INTRAVENOUS | Status: AC
  Administered 2020-09-28: 1000 mg via INTRAVENOUS
  Filled 2020-09-28: qty 100

## 2020-09-28 MED ORDER — METHOCARBAMOL 500 MG PO TABS
500.0000 mg | ORAL_TABLET | Freq: Four times a day (QID) | ORAL | Status: DC | PRN
  Administered 2020-09-28 – 2020-09-29 (×2): 500 mg via ORAL
  Filled 2020-09-28 (×2): qty 1

## 2020-09-28 MED ORDER — METFORMIN HCL ER 500 MG PO TB24
2000.0000 mg | ORAL_TABLET | Freq: Every day | ORAL | Status: DC
  Administered 2020-09-29: 2000 mg via ORAL
  Filled 2020-09-28: qty 4

## 2020-09-28 MED ORDER — STERILE WATER FOR IRRIGATION IR SOLN
Status: DC | PRN
  Administered 2020-09-28: 2000 mL

## 2020-09-28 MED ORDER — PROPOFOL 10 MG/ML IV BOLUS
INTRAVENOUS | Status: AC
  Filled 2020-09-28: qty 40

## 2020-09-28 MED ORDER — ACETAMINOPHEN 160 MG/5ML PO SOLN: 1000.0000 mg | Freq: Once | ORAL | Status: DC | PRN

## 2020-09-28 MED ORDER — KETAMINE HCL 10 MG/ML IJ SOLN
INTRAMUSCULAR | Status: DC | PRN
  Administered 2020-09-28: 10 mg via INTRAVENOUS
  Administered 2020-09-28: 20 mg via INTRAVENOUS
  Administered 2020-09-28: 10 mg via INTRAVENOUS

## 2020-09-28 MED ORDER — CHLORHEXIDINE GLUCONATE 0.12 % MT SOLN
15.0000 mL | Freq: Once | OROMUCOSAL | Status: AC
  Administered 2020-09-28: 15 mL via OROMUCOSAL

## 2020-09-28 MED ORDER — CHLORHEXIDINE GLUCONATE CLOTH 2 % EX PADS: 6.0000 | MEDICATED_PAD | Freq: Every day | CUTANEOUS | Status: DC

## 2020-09-28 MED ORDER — ACETAMINOPHEN 10 MG/ML IV SOLN
INTRAVENOUS | Status: DC | PRN
  Administered 2020-09-28: 1000 mg via INTRAVENOUS

## 2020-09-28 MED ORDER — ACETAMINOPHEN 500 MG PO TABS: 1000.0000 mg | ORAL_TABLET | Freq: Once | ORAL | Status: DC | PRN

## 2020-09-28 MED ORDER — BISACODYL 10 MG RE SUPP: 10.0000 mg | Freq: Every day | RECTAL | Status: DC | PRN

## 2020-09-28 MED ORDER — ORAL CARE MOUTH RINSE: 15.0000 mL | Freq: Once | OROMUCOSAL | Status: AC

## 2020-09-28 MED ORDER — ONDANSETRON HCL 4 MG/2ML IJ SOLN
INTRAMUSCULAR | Status: AC
  Filled 2020-09-28: qty 2

## 2020-09-28 MED ORDER — METOCLOPRAMIDE HCL 5 MG PO TABS: 5.0000 mg | ORAL_TABLET | Freq: Three times a day (TID) | ORAL | Status: DC | PRN

## 2020-09-28 MED ORDER — FENTANYL CITRATE (PF) 100 MCG/2ML IJ SOLN
25.0000 ug | INTRAMUSCULAR | Status: DC | PRN
  Administered 2020-09-28 (×2): 25 ug via INTRAVENOUS

## 2020-09-28 MED ORDER — METOCLOPRAMIDE HCL 5 MG/ML IJ SOLN: 5.0000 mg | Freq: Three times a day (TID) | INTRAMUSCULAR | Status: DC | PRN

## 2020-09-28 MED ORDER — MORPHINE SULFATE (PF) 2 MG/ML IV SOLN: 0.5000 mg | INTRAVENOUS | Status: DC | PRN

## 2020-09-28 MED ORDER — METHOCARBAMOL 500 MG IVPB - SIMPLE MED
500.0000 mg | Freq: Four times a day (QID) | INTRAVENOUS | Status: DC | PRN
  Administered 2020-09-28: 500 mg via INTRAVENOUS
  Filled 2020-09-28: qty 50

## 2020-09-28 MED ORDER — MENTHOL 3 MG MT LOZG: 1.0000 | LOZENGE | OROMUCOSAL | Status: DC | PRN

## 2020-09-28 MED ORDER — LISINOPRIL 10 MG PO TABS
10.0000 mg | ORAL_TABLET | Freq: Every morning | ORAL | Status: DC
  Administered 2020-09-29: 10 mg via ORAL
  Filled 2020-09-28: qty 1

## 2020-09-28 MED ORDER — ONDANSETRON HCL 4 MG/2ML IJ SOLN
INTRAMUSCULAR | Status: DC | PRN
  Administered 2020-09-28: 4 mg via INTRAVENOUS

## 2020-09-28 MED ORDER — OXYCODONE HCL 5 MG PO TABS
5.0000 mg | ORAL_TABLET | Freq: Once | ORAL | Status: AC | PRN
Start: 2020-09-28 — End: 2020-09-28

## 2020-09-28 MED ORDER — POLYETHYLENE GLYCOL 3350 17 G PO PACK: 17.0000 g | PACK | Freq: Every day | ORAL | Status: DC | PRN

## 2020-09-28 MED ORDER — ACETAMINOPHEN 10 MG/ML IV SOLN: 1000.0000 mg | Freq: Once | INTRAVENOUS | Status: DC | PRN

## 2020-09-28 MED ORDER — DEXAMETHASONE SODIUM PHOSPHATE 10 MG/ML IJ SOLN
INTRAMUSCULAR | Status: AC
  Filled 2020-09-28: qty 1

## 2020-09-28 SURGICAL SUPPLY — 42 items
BAG COUNTER SPONGE SURGICOUNT (BAG) IMPLANT
BAG DECANTER FOR FLEXI CONT (MISCELLANEOUS) IMPLANT
BAG ZIPLOCK 12X15 (MISCELLANEOUS) IMPLANT
BLADE SAG 18X100X1.27 (BLADE) ×2 IMPLANT
COVER PERINEAL POST (MISCELLANEOUS) ×2 IMPLANT
COVER SURGICAL LIGHT HANDLE (MISCELLANEOUS) ×2 IMPLANT
CUP ACETBLR 54 OD PINNACLE (Hips) ×2 IMPLANT
DERMABOND ADVANCED (GAUZE/BANDAGES/DRESSINGS) ×1
DERMABOND ADVANCED .7 DNX12 (GAUZE/BANDAGES/DRESSINGS) ×1 IMPLANT
DRAPE FOOT SWITCH (DRAPES) ×2 IMPLANT
DRAPE STERI IOBAN 125X83 (DRAPES) ×2 IMPLANT
DRAPE TOP 10253 STERILE (DRAPES) ×2 IMPLANT
DRAPE U-SHAPE 47X51 STRL (DRAPES) ×4 IMPLANT
DRESSING AQUACEL AG SP 3.5X10 (GAUZE/BANDAGES/DRESSINGS) ×1 IMPLANT
DRSG AQUACEL AG ADV 3.5X10 (GAUZE/BANDAGES/DRESSINGS) ×2 IMPLANT
DRSG AQUACEL AG SP 3.5X10 (GAUZE/BANDAGES/DRESSINGS) ×2
DURAPREP 26ML APPLICATOR (WOUND CARE) ×2 IMPLANT
ELECT REM PT RETURN 15FT ADLT (MISCELLANEOUS) ×2 IMPLANT
ELIMINATOR HOLE APEX DEPUY (Hips) ×2 IMPLANT
GLOVE SURG ENC MOIS LTX SZ6 (GLOVE) ×4 IMPLANT
GLOVE SURG UNDER LTX SZ7.5 (GLOVE) ×2 IMPLANT
GLOVE SURG UNDER POLY LF SZ6.5 (GLOVE) ×2 IMPLANT
GLOVE SURG UNDER POLY LF SZ7.5 (GLOVE) ×4 IMPLANT
GOWN STRL REUS W/TWL LRG LVL3 (GOWN DISPOSABLE) ×4 IMPLANT
HEAD CERAMIC 36 PLUS 8.5 12 14 (Hips) ×2 IMPLANT
HOLDER FOLEY CATH W/STRAP (MISCELLANEOUS) ×2 IMPLANT
KIT TURNOVER KIT A (KITS) ×2 IMPLANT
LINER NEUTRAL 54X36MM PLUS 4 (Hips) ×2 IMPLANT
PACK ANTERIOR HIP CUSTOM (KITS) ×2 IMPLANT
PENCIL SMOKE EVACUATOR (MISCELLANEOUS) IMPLANT
SCREW 6.5MMX25MM (Screw) ×2 IMPLANT
SLEEVE SURGEON STRL (DRAPES) ×2 IMPLANT
STEM FEMORAL SZ5 HIGH ACTIS (Stem) ×2 IMPLANT
SUT MNCRL AB 4-0 PS2 18 (SUTURE) ×2 IMPLANT
SUT STRATAFIX 0 PDS 27 VIOLET (SUTURE) ×2
SUT VIC AB 1 CT1 36 (SUTURE) ×6 IMPLANT
SUT VIC AB 2-0 CT1 27 (SUTURE) ×2
SUT VIC AB 2-0 CT1 TAPERPNT 27 (SUTURE) ×2 IMPLANT
SUTURE STRATFX 0 PDS 27 VIOLET (SUTURE) ×1 IMPLANT
TRAY FOLEY MTR SLVR 16FR STAT (SET/KITS/TRAYS/PACK) IMPLANT
TUBE SUCTION HIGH CAP CLEAR NV (SUCTIONS) ×2 IMPLANT
WATER STERILE IRR 1000ML POUR (IV SOLUTION) ×2 IMPLANT

## 2020-09-28 NOTE — Anesthesia Procedure Notes (Signed)
Spinal  Patient location during procedure: OR Start time: 09/28/2020 8:54 AM End time: 09/28/2020 9:04 AM Reason for block: surgical anesthesia Staffing Performed: anesthesiologist  Anesthesiologist: Oleta Mouse, MD Preanesthetic Checklist Completed: patient identified, IV checked, risks and benefits discussed, surgical consent, monitors and equipment checked, pre-op evaluation and timeout performed Spinal Block Patient position: sitting Prep: DuraPrep Patient monitoring: heart rate, cardiac monitor, continuous pulse ox and blood pressure Approach: midline Location: L5-S1 Injection technique: single-shot Needle Needle type: Pencan  Needle gauge: 24 G Needle length: 9 cm Assessment Sensory level: L1 Events: CSF return and second provider Additional Notes Previous back surgery incision present over lumbar spine. Underlying anatomy difficult to identify. Attempt at L3/4 by CRNA unsuccessful. I attempted at L4/5 and L5/S1. CSF obtained but aspiration was intermittent. Decision to inject and evaluate level rather than continue attempts. Level inadequate for surgical analgesia. Decision to proceed with general.

## 2020-09-28 NOTE — Anesthesia Procedure Notes (Signed)
Procedure Name: MAC Date/Time: 09/28/2020 8:51 AM Performed by: Eben Burow, CRNA Pre-anesthesia Checklist: Patient identified, Emergency Drugs available, Suction available, Patient being monitored and Timeout performed Oxygen Delivery Method: Simple face mask Placement Confirmation: positive ETCO2

## 2020-09-28 NOTE — Discharge Instructions (Signed)

## 2020-09-28 NOTE — Anesthesia Procedure Notes (Signed)
Procedure Name: LMA Insertion Date/Time: 09/28/2020 9:30 AM Performed by: Eben Burow, CRNA Pre-anesthesia Checklist: Patient identified, Emergency Drugs available, Suction available, Patient being monitored and Timeout performed Patient Re-evaluated:Patient Re-evaluated prior to induction Oxygen Delivery Method: Circle system utilized Preoxygenation: Pre-oxygenation with 100% oxygen Induction Type: IV induction Ventilation: Mask ventilation without difficulty LMA: LMA inserted LMA Size: 4.0 Number of attempts: 1 Tube secured with: Tape Dental Injury: Teeth and Oropharynx as per pre-operative assessment

## 2020-09-28 NOTE — Transfer of Care (Signed)
Immediate Anesthesia Transfer of Care Note  Patient: [name]  Procedure(s) Performed: TOTAL HIP ARTHROPLASTY ANTERIOR APPROACH (Right: Hip)  Patient Location: PACU  Anesthesia Type:General  Level of Consciousness: awake, alert , oriented and patient cooperative  Airway & Oxygen Therapy: Patient Spontanous Breathing and Patient connected to face mask oxygen  Post-op Assessment: Report given to RN and Post -op Vital signs reviewed and stable  Post vital signs: Reviewed and stable  Last Vitals:  Vitals Value Taken Time  BP 143/75 09/28/20 1045  Temp    Pulse 61 09/28/20 1045  Resp 11 09/28/20 1045  SpO2 100 % 09/28/20 1045  Vitals shown include unvalidated device data.  Last Pain:  Vitals:   09/28/20 0704  TempSrc:   PainSc: 4       Patients Stated Pain Goal: 3 (0000000 [id number])  Complications: No notable events documented.

## 2020-09-28 NOTE — Evaluation (Signed)
Physical Therapy Evaluation Patient Details Name: [name] MRN: [id number] DOB: [birth year] Today's Date: 09/28/2020   History of Present Illness  Patient is 70 y.o. male s/p Rt THA anterior approach on 09/28/20 with PMH significant for OA, bladder cancer, DM, HLD, HTN, Rt foot drop.   Clinical Impression  [name] is a 70 y.o. male POD 0 s/p Rt THA. Patient reports independence with mobility at baseline. Patient is now limited by functional impairments (see PT problem list below) and requires min assist for transfers and gait with RW. Patient was able to ambulate ~60 feet with RW and min assist. Patient instructed in exercise to facilitate circulation to manage edema and reduce risk of DVT. Patient will benefit from continued skilled PT interventions to address impairments and progress towards PLOF. Acute PT will follow to progress mobility and stair training in preparation for safe discharge home.     Follow Up Recommendations Follow surgeon's recommendation for DC plan and follow-up therapies    Equipment Recommendations  None recommended by PT    Recommendations for Other Services       Precautions / Restrictions Precautions Precautions: Fall Restrictions Weight Bearing Restrictions: No Other Position/Activity Restrictions: WBAT      Mobility  Bed Mobility Overal bed mobility: Needs Assistance Bed Mobility: Supine to Sit     Supine to sit: Min assist     General bed mobility comments: cues to use bed rail and use gait belt to assist with Rt LE off EOB.    Transfers Overall transfer level: Needs assistance Equipment used: Rolling walker (2 wheeled) Transfers: Sit to/from Stand Sit to Stand: Min assist         General transfer comment: cues for technique to power up and assist to steady with rise.  Ambulation/Gait Ambulation/Gait assistance: Min assist Gait Distance (Feet): 60 Feet Assistive device: Rolling walker (2 wheeled) Gait  Pattern/deviations: Step-to pattern;Decreased stride length;Decreased weight shift to right;Decreased step length - right Gait velocity: decr   General Gait Details: cues for safe step pattern and assist to maintain safe proximity to RW. proximity to RW, no overt LOB noted throughout.  Stairs            Wheelchair Mobility    Modified Rankin (Stroke Patients Only)       Balance Overall balance assessment: Needs assistance Sitting-balance support: Feet supported Sitting balance-Leahy Scale: Good     Standing balance support: Bilateral upper extremity supported;During functional activity Standing balance-Leahy Scale: Poor                               Pertinent Vitals/Pain Pain Assessment: 0-10 Pain Score: 5  Pain Location: Rt hip Pain Descriptors / Indicators: Aching;Discomfort Pain Intervention(s): Limited activity within patient's tolerance;Monitored during session;Repositioned;Ice applied;Patient requesting pain meds-RN notified    Home Living Family/patient expects to be discharged to:: Private residence Living Arrangements: Spouse/significant other Available Help at Discharge: Family Type of Home: House Home Access: Stairs to enter Entrance Stairs-Rails: Left Entrance Stairs-Number of Steps: 5 Home Layout: One level Home Equipment: [place] - 2 wheels;Cane - single point;Shower seat;Grab bars - tub/shower      Prior Function Level of Independence: Independent         Comments: pt walking with no device but wife states he wasn't far from needing one     Hand Dominance   Dominant Hand: Right    Extremity/Trunk Assessment   Upper Extremity Assessment  Upper Extremity Assessment: Overall WFL for tasks assessed    Lower Extremity Assessment Lower Extremity Assessment: Overall WFL for tasks assessed    Cervical / Trunk Assessment Cervical / Trunk Assessment: Normal  Communication   Communication: No difficulties  Cognition  Arousal/Alertness: Awake/alert Behavior During Therapy: WFL for tasks assessed/performed Overall Cognitive Status: Within Functional Limits for tasks assessed                                        General Comments      Exercises Total Joint Exercises Ankle Circles/Pumps: AROM;Both;20 reps;Seated   Assessment/Plan    PT Assessment Patient needs continued PT services  PT Problem List Decreased strength;Decreased range of motion;Decreased activity tolerance;Decreased balance;Decreased mobility;Decreased knowledge of use of DME;Decreased knowledge of precautions;Pain       PT Treatment Interventions DME instruction;Gait training;Stair training;Functional mobility training;Therapeutic activities;Therapeutic exercise;Balance training;Patient/family education    PT Goals (Current goals can be found in the Care Plan section)  Acute Rehab PT Goals Patient Stated Goal: get back indpendence to go home PT Goal Formulation: With patient Time For Goal Achievement: 10/05/20 Potential to Achieve Goals: Good    Frequency 7X/week   Barriers to discharge        Co-evaluation               AM-PAC PT "6 Clicks" Mobility  Outcome Measure Help needed turning from your back to your side while in a flat bed without using bedrails?: A Little Help needed moving from lying on your back to sitting on the side of a flat bed without using bedrails?: A Little Help needed moving to and from a bed to a chair (including a wheelchair)?: A Little Help needed standing up from a chair using your arms (e.g., wheelchair or bedside chair)?: A Little Help needed to walk in hospital room?: A Little Help needed climbing 3-5 steps with a railing? : A Lot 6 Click Score: 17    End of Session Equipment Utilized During Treatment: Gait belt Activity Tolerance: Patient tolerated treatment well Patient left: in chair;with call bell/phone within reach;with chair alarm set;with family/visitor  present Nurse Communication: Mobility status;Patient requests pain meds PT Visit Diagnosis: Muscle weakness (generalized) (M62.81);Difficulty in walking, not elsewhere classified (R26.2)    Time: [id number] PT Time Calculation (min) (ACUTE ONLY): 21 min   Charges:   PT Evaluation $PT Eval Low Complexity: 1 Low          Verner Mould, DPT Acute Rehabilitation Services Office [phone] Pager [phone]   Jacques Navy 09/28/2020, 4:38 PM

## 2020-09-28 NOTE — Anesthesia Preprocedure Evaluation (Signed)
Anesthesia Evaluation  Patient identified by MRN, date of birth, ID band Patient awake    Reviewed: Allergy & Precautions, NPO status , Patient's Chart, lab work & pertinent test results  History of Anesthesia Complications (+) PONV and history of anesthetic complications  Airway Mallampati: I  TM Distance: >3 FB Neck ROM: Full    Dental  (+) Dental Advisory Given, Teeth Intact   Pulmonary neg pulmonary ROS, neg shortness of breath, neg sleep apnea, neg COPD, neg recent URI,    breath sounds clear to auscultation       Cardiovascular hypertension, Pt. on medications  Rhythm:Regular     Neuro/Psych negative neurological ROS  negative psych ROS   GI/Hepatic negative GI ROS, Neg liver ROS,   Endo/Other  diabetes, Type 2  Renal/GU      Musculoskeletal  (+) Arthritis ,   Abdominal   Peds  Hematology negative hematology ROS (+) Lab Results      Component                Value               Date                      WBC                      5.4                 09/16/2020                HGB                      16.0                09/16/2020                HCT                      46.7                09/16/2020                MCV                      90.2                09/16/2020                PLT                      190                 09/16/2020           No results found for: INR, PROTIME    Anesthesia Other Findings   Reproductive/Obstetrics                             Anesthesia Physical Anesthesia Plan  ASA: 2  Anesthesia Plan: MAC and Spinal   Post-op Pain Management:    Induction: Intravenous  PONV Risk Score and Plan: 2 and Propofol infusion and Treatment may vary due to age or medical condition  Airway Management Planned: Nasal Cannula  Additional Equipment: None  Intra-op Plan:   Post-operative Plan:   Informed Consent: I have reviewed the patients History and  Physical, chart, labs and discussed the procedure including the risks, benefits and alternatives for the proposed anesthesia with the patient or authorized representative who has indicated his/her understanding and acceptance.     Dental advisory given  Plan Discussed with: CRNA and Anesthesiologist  Anesthesia Plan Comments:         Anesthesia Quick Evaluation

## 2020-09-28 NOTE — Interval H&P Note (Signed)
History and Physical Interval Note:  09/28/2020 6:55 AM  [name]  has presented today for surgery, with the diagnosis of Right hip osteoarthritis.  The various methods of treatment have been discussed with the patient and family. After consideration of risks, benefits and other options for treatment, the patient has consented to  Procedure(s): TOTAL HIP ARTHROPLASTY ANTERIOR APPROACH (Right) as a surgical intervention.  The patient's history has been reviewed, patient examined, no change in status, stable for surgery.  I have reviewed the patient's chart and labs.  Questions were answered to the patient's satisfaction.     Mauri Pole

## 2020-09-28 NOTE — Plan of Care (Signed)
  Problem: Activity: Goal: Ability to avoid complications of mobility impairment will improve Outcome: Progressing Goal: Ability to tolerate increased activity will improve Outcome: Progressing   Problem: Pain Management: Goal: Pain level will decrease with appropriate interventions Outcome: Progressing   

## 2020-09-28 NOTE — Op Note (Signed)
NAME:  [name]                ACCOUNT NO.: [id number]      MEDICAL RECORD NO.: [id number]      FACILITY:  [facility]      PHYSICIAN:  Mauri Pole  DATE OF BIRTH:  [birth year]     DATE OF PROCEDURE:  09/28/2020                                 OPERATIVE REPORT         PREOPERATIVE DIAGNOSIS: Right  hip osteoarthritis.      POSTOPERATIVE DIAGNOSIS:  Right hip osteoarthritis.      PROCEDURE:  Right total hip replacement through an anterior approach   utilizing DePuy THR system, component size 54 mm pinnacle cup, a size 36+4 neutral   Altrex liner, a size 5 Hi Actis stem with a 36+8.5 delta ceramic   ball.      SURGEON:  Pietro Cassis. Alvan Dame, M.D.      ASSISTANT:  Costella Hatcher, PA-C     ANESTHESIA:  Spinal.      SPECIMENS:  None.      COMPLICATIONS:  None.      BLOOD LOSS:  500 cc     DRAINS:  None.      INDICATION OF THE PROCEDURE:  [name] is a 70 y.o. male who had   presented to office for evaluation of right hip pain.  Radiographs revealed   progressive degenerative changes with bone-on-bone   articulation of the  hip joint, including subchondral cystic changes and osteophytes.  The patient had painful limited range of   motion significantly affecting their overall quality of life and function.  The patient was failing to    respond to conservative measures including medications and/or injections and activity modification and at this point was ready   to proceed with more definitive measures.  Consent was obtained for   benefit of pain relief.  Specific risks of infection, DVT, component   failure, dislocation, neurovascular injury, and need for revision surgery were reviewed in the office as well discussion of   the anterior versus posterior approach were reviewed.     PROCEDURE IN DETAIL:  The patient was brought to operative theater.   Once adequate anesthesia, preoperative antibiotics, 2 gm of Ancef, 1 gm of Tranexamic  Acid, and 10 mg of Decadron were administered, the patient was positioned supine on the [employer] table.  Once the patient was safely positioned with adequate padding of boney prominences we predraped out the hip, and used fluoroscopy to confirm orientation of the pelvis.      The right hip was then prepped and draped from proximal iliac crest to   mid thigh with a shower curtain technique.      Time-out was performed identifying the patient, planned procedure, and the appropriate extremity.     An incision was then made 2 cm lateral to the   anterior superior iliac spine extending over the orientation of the   tensor fascia lata muscle and sharp dissection was carried down to the   fascia of the muscle.      The fascia was then incised.  The muscle belly was identified and swept   laterally and retractor placed along the superior neck.  Following   cauterization of the circumflex vessels and removing some  pericapsular   fat, a second cobra retractor was placed on the inferior neck.  A T-capsulotomy was made along the line of the   superior neck to the trochanteric fossa, then extended proximally and   distally.  Tag sutures were placed and the retractors were then placed   intracapsular.  We then identified the trochanteric fossa and   orientation of my neck cut and then made a neck osteotomy with the femur on traction.  The femoral   head was removed without difficulty or complication.  Traction was let   off and retractors were placed posterior and anterior around the   acetabulum.      The labrum and foveal tissue were debrided.  I began reaming with a 46 mm   reamer and reamed up to 53 mm reamer with good bony bed preparation and a 54 mm  cup was chosen.  The final 54 mm Pinnacle cup was then impacted under fluoroscopy to confirm the depth of penetration and orientation with respect to   Abduction and forward flexion.  A screw was placed into the ilium followed by the hole eliminator.   The final   36+4 neutral Altrex liner was impacted with good visualized rim fit.  The cup was positioned anatomically within the acetabular portion of the pelvis.      At this point, the femur was rolled to 100 degrees.  Further capsule was   released off the inferior aspect of the femoral neck.  I then   released the superior capsule proximally.  With the leg in a neutral position the hook was placed laterally   along the femur under the vastus lateralis origin and elevated manually and then held in position using the hook attachment on the bed.  The leg was then extended and adducted with the leg rolled to 100   degrees of external rotation.  Retractors were placed along the medial calcar and posteriorly over the greater trochanter.  Once the proximal femur was fully   exposed, I used a box osteotome to set orientation.  I then began   broaching with the starting chili pepper broach and passed this by hand and then broached up to 5.  With the 5 broach in place I chose a high offset neck and did several trial reductions.  The offset was appropriate, leg lengths   appeared to be equal best matched with the +8.5 head ball trial confirmed radiographically.   Given these findings, I went ahead and dislocated the hip, repositioned all   retractors and positioned the right hip in the extended and abducted position.  The final 5 Hi Actis stem was   chosen and it was impacted down to the level of neck cut.  Based on this   and the trial reductions, a final 36+8.5 delta ceramic ball was chosen and   impacted onto a clean and dry trunnion, and the hip was reduced.  The   hip had been irrigated throughout the case again at this point.  I did   reapproximate the superior capsular leaflet to the anterior leaflet   using #1 Vicryl.  The fascia of the   tensor fascia lata muscle was then reapproximated using #1 Vicryl and #0 Stratafix sutures.  The   remaining wound was closed with 2-0 Vicryl and running 4-0  Monocryl.   The hip was cleaned, dried, and dressed sterilely using Dermabond and   Aquacel dressing.  The patient was then brought   to  recovery room in stable condition tolerating the procedure well.    Costella Hatcher, PA-C was present for the entirety of the case involved from   preoperative positioning, perioperative retractor management, general   facilitation of the case, as well as primary wound closure as assistant.            Pietro Cassis Alvan Dame, M.D.        09/28/2020 8:59 AM

## 2020-09-29 DIAGNOSIS — M1611 Unilateral primary osteoarthritis, right hip: Secondary | ICD-10-CM | POA: Diagnosis not present

## 2020-09-29 LAB — CBC
HCT: 33.2 % — ABNORMAL LOW (ref 39.0–52.0)
Hemoglobin: 11.2 g/dL — ABNORMAL LOW (ref 13.0–17.0)
MCH: 30.7 pg (ref 26.0–34.0)
MCHC: 33.7 g/dL (ref 30.0–36.0)
MCV: 91 fL (ref 80.0–100.0)
Platelets: 185 10*3/uL (ref 150–400)
RBC: 3.65 MIL/uL — ABNORMAL LOW (ref 4.22–5.81)
RDW: 13.2 % (ref 11.5–15.5)
WBC: 11.3 10*3/uL — ABNORMAL HIGH (ref 4.0–10.5)
nRBC: 0 % (ref 0.0–0.2)

## 2020-09-29 LAB — BASIC METABOLIC PANEL
Anion gap: 7 (ref 5–15)
BUN: 22 mg/dL (ref 8–23)
CO2: 27 mmol/L (ref 22–32)
Calcium: 8.4 mg/dL — ABNORMAL LOW (ref 8.9–10.3)
Chloride: 101 mmol/L (ref 98–111)
Creatinine, Ser: 1.17 mg/dL (ref 0.61–1.24)
GFR, Estimated: >60 mL/min (ref >60)
Glucose, Bld: 203 mg/dL — ABNORMAL HIGH (ref 70–99)
Potassium: 4.8 mmol/L (ref 3.5–5.1)
Sodium: 135 mmol/L (ref 135–145)

## 2020-09-29 LAB — GLUCOSE, CAPILLARY
Glucose-Capillary: 163 mg/dL — ABNORMAL HIGH (ref 70–99)
Glucose-Capillary: 145 mg/dL — ABNORMAL HIGH (ref 70–99)

## 2020-09-29 MED ORDER — METHOCARBAMOL 500 MG PO TABS: 500.0000 mg | ORAL_TABLET | Freq: Four times a day (QID) | ORAL | 0 refills | Status: AC | PRN

## 2020-09-29 MED ORDER — ASPIRIN 81 MG PO CHEW: 81.0000 mg | CHEWABLE_TABLET | Freq: Two times a day (BID) | ORAL | 0 refills | Status: AC

## 2020-09-29 MED ORDER — DOCUSATE SODIUM 100 MG PO CAPS: 100.0000 mg | ORAL_CAPSULE | Freq: Two times a day (BID) | ORAL | 0 refills | Status: AC

## 2020-09-29 MED ORDER — CELECOXIB 200 MG PO CAPS: 200.0000 mg | ORAL_CAPSULE | Freq: Two times a day (BID) | ORAL | 0 refills | Status: DC

## 2020-09-29 MED ORDER — HYDROCODONE-ACETAMINOPHEN 5-325 MG PO TABS: 1.0000 | ORAL_TABLET | ORAL | 0 refills | Status: DC | PRN

## 2020-09-29 MED ORDER — POLYETHYLENE GLYCOL 3350 17 G PO PACK: 17.0000 g | PACK | Freq: Every day | ORAL | 0 refills | Status: AC | PRN

## 2020-09-29 NOTE — TOC Transition Note (Signed)
Transition of Care [facility]) - CM/SW Discharge Note  Patient Details  Name: [name] MRN: [id number] Date of Birth: [birth year]  Transition of Care [facility]) CM/SW Contact:  Sherie Don, LCSW Phone Number: 09/29/2020, 10:18 AM  Clinical Narrative: Patient is expected to discharge after working with PT. CSW met with patient to confirm discharge plan. Patient will discharge home with a home exercise program (HEP). Patient has a rolling walker and high toilets at home, so there are no DME needs at this time. TOC signing off.  Final next level of care: Home/Self Care Barriers to Discharge: No Barriers Identified  Patient Goals and CMS Choice Patient states their goals for this hospitalization and ongoing recovery are:: Discharge home with HEP CMS Medicare.gov Compare Post Acute Care list provided to:: Patient Choice offered to / list presented to : NA  Discharge Plan and Services       DME Arranged: N/A DME Agency: NA  Readmission Risk Interventions No flowsheet data found.

## 2020-09-29 NOTE — Progress Notes (Signed)
   Subjective: 1 Day Post-Op Procedure(s) (LRB): TOTAL HIP ARTHROPLASTY ANTERIOR APPROACH (Right) Patient reports pain as mild.   Patient seen in rounds for Dr. Alvan Dame. Patient is well, and has had no acute complaints or problems. No acute events overnight. Ambulated 60 feet with PT yesterday. Voiding without difficulty.  We will continue therapy today.   Objective: Vital signs in last 24 hours: Temp:  [97.2 F (36.2 C)-98.1 F (36.7 C)] 98.1 F (36.7 C) (08/24 0459) Pulse Rate:  [56-90] 79 (08/24 0459) Resp:  [11-18] 17 (08/24 0459) BP: (116-160)/(55-100) 116/67 (08/24 0459) SpO2:  [97 %-100 %] 97 % (08/24 0459)  Intake/Output from previous day:  Intake/Output Summary (Last 24 hours) at 09/29/2020 0844 Last data filed at 09/29/2020 0600 Gross per 24 hour  Intake 5598.43 ml  Output 3400 ml  Net 2198.43 ml     Intake/Output this shift: No intake/output data recorded.  Labs: Recent Labs    09/29/20 0341  HGB 11.2*   Recent Labs    09/29/20 0341  WBC 11.3*  RBC 3.65*  HCT 33.2*  PLT 185   Recent Labs    09/29/20 0341  NA 135  K 4.8  CL 101  CO2 27  BUN 22  CREATININE 1.17  GLUCOSE 203*  CALCIUM 8.4*   No results for input(s): LABPT, INR in the last 72 hours.  Exam: General - Patient is Alert and Oriented Extremity - Neurologically intact Sensation intact distally Intact pulses distally Dorsiflexion/Plantar flexion intact Dressing - dressing C/D/I Motor Function - intact, moving foot and toes well on exam.   Past Medical History:  Diagnosis Date   Arthritis    Bladder cancer ([place])    Cancer ([place]) 2007   basal cell ca on nose....had Mohs done   Diabetes mellitus without complication (HCC)    Diabetic peripheral neuropathy (HCC)    Fatty liver disease, nonalcoholic    Foot drop, right foot    Hyperlipidemia    Hypertension    Lumbar stenosis    Post-operative nausea and vomiting    Post-operative nausea and vomiting    one time   Wears  glasses     Assessment/Plan: 1 Day Post-Op Procedure(s) (LRB): TOTAL HIP ARTHROPLASTY ANTERIOR APPROACH (Right) Active Problems:   S/P right total hip arthroplasty  Estimated body mass index is 26.58 kg/m as calculated from the following:   Height as of this encounter: '5\' 9"'$  (1.753 m).   Weight as of this encounter: 81.6 kg. Advance diet Up with therapy D/C IV fluids  DVT Prophylaxis - Aspirin Weight bearing as tolerated.  Plan is to go Home after hospital stay. Plan for discharge home today after 1-2 sessions of therapy as long as he is meeting his goals. Follow up in the office in 2 weeks.   Griffith Citron, PA-C Orthopedic Surgery [phone] 09/29/2020, 8:44 AM

## 2020-09-29 NOTE — Progress Notes (Signed)
Physical Therapy Treatment Patient Details Name: [name] MRN: [id number] DOB: 15-Oct-1950 Today's Date: 09/29/2020    History of Present Illness Patient is 70 y.o. male s/p Rt THA anterior approach on 09/28/20 with PMH significant for OA, bladder cancer, DM, HLD, HTN, Rt foot drop.    PT Comments    Pt ambulated in hallway and practiced safe stair technique.  Pt's spouse present and observed session.  Pt provided with HEP handout and had no further questions.  Pt ready for d/c home today.    Follow Up Recommendations  Follow surgeon's recommendation for DC plan and follow-up therapies     Equipment Recommendations  None recommended by PT    Recommendations for Other Services       Precautions / Restrictions Precautions Precautions: Fall Restrictions RLE Weight Bearing: Weight bearing as tolerated Other Position/Activity Restrictions: WBAT    Mobility  Bed Mobility Overal bed mobility: Needs Assistance Bed Mobility: Supine to Sit     Supine to sit: Supervision;HOB elevated     General bed mobility comments: pt in recliner    Transfers Overall transfer level: Needs assistance Equipment used: Rolling walker (2 wheeled) Transfers: Sit to/from Stand Sit to Stand: Min guard;Supervision         General transfer comment: cues for technique  Ambulation/Gait Ambulation/Gait assistance: Min Gaffer (Feet): 160 Feet Assistive device: Rolling walker (2 wheeled) Gait Pattern/deviations: Decreased stride length;Step-through pattern;Antalgic;Decreased stance time - right     General Gait Details: steady with RW   Stairs Stairs: Yes Stairs assistance: Min guard Stair Management: Step to pattern;One rail Left;Forwards;With cane Number of Stairs: 2 General stair comments: pt performed with left rail however required right rail for descending (however only has left rail at home), pt performed twice more with left rail and SPC, spouse  present and observed   Wheelchair Mobility    Modified Rankin (Stroke Patients Only)       Balance                                            Cognition Arousal/Alertness: Awake/alert Behavior During Therapy: WFL for tasks assessed/performed Overall Cognitive Status: Within Functional Limits for tasks assessed                                        Exercises     General Comments        Pertinent Vitals/Pain Pain Assessment: 0-10 Pain Score: 5  Pain Location: Rt hip Pain Descriptors / Indicators: Aching;Discomfort;Sore Pain Intervention(s): Repositioned;Monitored during session    Home Living                      Prior Function            PT Goals (current goals can now be found in the care plan section) Progress towards PT goals: Progressing toward goals    Frequency    7X/week      PT Plan Current plan remains appropriate    Co-evaluation              AM-PAC PT "6 Clicks" Mobility   Outcome Measure  Help needed turning from your back to your side while in a flat bed without using bedrails?: A Little Help needed moving  from lying on your back to sitting on the side of a flat bed without using bedrails?: A Little Help needed moving to and from a bed to a chair (including a wheelchair)?: A Little Help needed standing up from a chair using your arms (e.g., wheelchair or bedside chair)?: A Little Help needed to walk in hospital room?: A Little Help needed climbing 3-5 steps with a railing? : A Little 6 Click Score: 18    End of Session Equipment Utilized During Treatment: Gait belt Activity Tolerance: Patient tolerated treatment well Patient left: in chair;with call bell/phone within reach;with family/visitor present Nurse Communication: Mobility status;Patient requests pain meds PT Visit Diagnosis: Muscle weakness (generalized) (M62.81);Difficulty in walking, not elsewhere classified (R26.2)      Time: [id number] PT Time Calculation (min) (ACUTE ONLY): 16 min  Charges:  $Gait Training: 8-22 mins                    Arlyce Dice, DPT Acute Rehabilitation Services Pager: [phone] Office: [phone]    [name] 09/29/2020, 2:09 PM

## 2020-09-29 NOTE — Progress Notes (Signed)
Physical Therapy Treatment Patient Details Name: [name] MRN: [id number] DOB: 1950/02/21 Today's Date: 09/29/2020    History of Present Illness Patient is 70 y.o. male s/p Rt THA anterior approach on 09/28/20 with PMH significant for OA, bladder cancer, DM, HLD, HTN, Rt foot drop.    PT Comments    Pt ambulated in hallway and performed LE exercises.  Will return for afternoon session and practice steps.  Follow Up Recommendations  Follow surgeon's recommendation for DC plan and follow-up therapies     Equipment Recommendations  None recommended by PT    Recommendations for Other Services       Precautions / Restrictions Precautions Precautions: Fall Restrictions Other Position/Activity Restrictions: WBAT    Mobility  Bed Mobility Overal bed mobility: Needs Assistance Bed Mobility: Supine to Sit     Supine to sit: Supervision;HOB elevated          Transfers Overall transfer level: Needs assistance Equipment used: Rolling walker (2 wheeled) Transfers: Sit to/from Stand Sit to Stand: Min guard         General transfer comment: cues for technique  Ambulation/Gait Ambulation/Gait assistance: Min guard Gait Distance (Feet): 160 Feet Assistive device: Rolling walker (2 wheeled) Gait Pattern/deviations: Step-to pattern;Decreased stride length;Step-through pattern;Antalgic;Decreased stance time - right     General Gait Details: verbal cues for initial sequence, pt progressed to step through   [employer] Mobility    Modified Rankin (Stroke Patients Only)       Balance                                            Cognition Arousal/Alertness: Awake/alert Behavior During Therapy: WFL for tasks assessed/performed Overall Cognitive Status: Within Functional Limits for tasks assessed                                        Exercises Total Joint Exercises Ankle Circles/Pumps:  AROM;Both;10 reps Quad Sets: AROM;Both;10 reps Heel Slides: AAROM;Right;10 reps Hip ABduction/ADduction: AAROM;Supine;Right;10 reps;Other (comment) (also standing, Right LE, AROM, 10 reps) Long Arc Quad: AROM;Right;10 reps;Seated Marching in Standing: AROM;Right;Standing;10 reps Standing Hip Extension: AROM;Right;Standing;10 reps    General Comments        Pertinent Vitals/Pain Pain Assessment: 0-10 Pain Score: 4  Pain Location: Rt hip Pain Descriptors / Indicators: Aching;Discomfort;Sore Pain Intervention(s): Repositioned;Monitored during session    Home Living                      Prior Function            PT Goals (current goals can now be found in the care plan section) Progress towards PT goals: Progressing toward goals    Frequency    7X/week      PT Plan Current plan remains appropriate    Co-evaluation              AM-PAC PT "6 Clicks" Mobility   Outcome Measure  Help needed turning from your back to your side while in a flat bed without using bedrails?: A Little Help needed moving from lying on your back to sitting on the side of a flat bed without using bedrails?: A Little Help needed moving to and  from a bed to a chair (including a wheelchair)?: A Little Help needed standing up from a chair using your arms (e.g., wheelchair or bedside chair)?: A Little Help needed to walk in hospital room?: A Little Help needed climbing 3-5 steps with a railing? : A Little 6 Click Score: 18    End of Session Equipment Utilized During Treatment: Gait belt Activity Tolerance: Patient tolerated treatment well Patient left: in chair;with call bell/phone within reach   PT Visit Diagnosis: Muscle weakness (generalized) (M62.81);Difficulty in walking, not elsewhere classified (R26.2)     Time: [id number] PT Time Calculation (min) (ACUTE ONLY): 23 min  Charges:  $Gait Training: 8-22 mins $Therapeutic Exercise: 8-22 mins                    Jannette Spanner PT,  DPT Acute Rehabilitation Services Pager: [phone] Office: [phone]    [name] 09/29/2020, 1:11 PM

## 2020-09-30 ENCOUNTER — Encounter (HOSPITAL_COMMUNITY): Payer: Self-pay | Admitting: Orthopedic Surgery

## 2020-10-04 NOTE — Discharge Summary (Signed)
Physician Discharge Summary   Patient ID: [name] MRN: [id number] DOB/AGE: Apr 03, 1950 70 y.o.  Admit date: 09/28/2020 Discharge date: 09/29/2020  Primary Diagnosis:  Right  hip osteoarthritis  Admission Diagnoses:  Past Medical History:  Diagnosis Date   Arthritis    Bladder cancer ([place])    Cancer ([place]) 2007   basal cell ca on nose....had Mohs done   Diabetes mellitus without complication (HCC)    Diabetic peripheral neuropathy (HCC)    Fatty liver disease, nonalcoholic    Foot drop, right foot    Hyperlipidemia    Hypertension    Lumbar stenosis    Post-operative nausea and vomiting    Post-operative nausea and vomiting    one time   Wears glasses    Discharge Diagnoses:   Active Problems:   S/P right total hip arthroplasty  Estimated body mass index is 26.58 kg/m as calculated from the following:   Height as of this encounter: '5\' 9"'$  (1.753 m).   Weight as of this encounter: 81.6 kg.  Procedure:  Procedure(s) (LRB): TOTAL HIP ARTHROPLASTY ANTERIOR APPROACH (Right)   Consults: None  HPI: [name] is a 70 y.o. male who had   presented to office for evaluation of right hip pain.  Radiographs revealed   progressive degenerative changes with bone-on-bone   articulation of the  hip joint, including subchondral cystic changes and osteophytes.  The patient had painful limited range of   motion significantly affecting their overall quality of life and function.  The patient was failing to    respond to conservative measures including medications and/or injections and activity modification and at this point was ready   to proceed with more definitive measures.  Consent was obtained for   benefit of pain relief.  Specific risks of infection, DVT, component   failure, dislocation, neurovascular injury, and need for revision surgery were reviewed in the office as well discussion of   the anterior versus posterior approach were reviewed.     Laboratory  Data: Admission on 09/28/2020, Discharged on 09/29/2020  Component Date Value Ref Range Status   ABO/RH(D) 09/28/2020    Final                   Value:O NEG Performed at [facility] [place] [address], [place], [place] [address]    Glucose-Capillary 09/28/2020 107 (A) 70 - 99 mg/dL Final   Glucose reference range applies only to samples taken after fasting for at least 8 hours.   Glucose-Capillary 09/28/2020 180 (A) 70 - 99 mg/dL Final   Glucose reference range applies only to samples taken after fasting for at least 8 hours.   Glucose-Capillary 09/28/2020 264 (A) 70 - 99 mg/dL Final   Glucose reference range applies only to samples taken after fasting for at least 8 hours.   WBC 09/29/2020 11.3 (A) 4.0 - 10.5 K/uL Final   RBC 09/29/2020 3.65 (A) 4.22 - 5.81 MIL/uL Final   Hemoglobin 09/29/2020 11.2 (A) 13.0 - 17.0 g/dL Final   HCT 09/29/2020 33.2 (A) 39.0 - 52.0 % Final   MCV 09/29/2020 91.0  80.0 - 100.0 fL Final   MCH 09/29/2020 30.7  26.0 - 34.0 pg Final   MCHC 09/29/2020 33.7  30.0 - 36.0 g/dL Final   RDW 09/29/2020 13.2  11.5 - 15.5 % Final   Platelets 09/29/2020 185  150 - 400 K/uL Final   nRBC 09/29/2020 0.0  0.0 - 0.2 % Final   Performed  at [facility], [place] [address] [place], [place] [address]   Sodium 09/29/2020 135  135 - 145 mmol/L Final   Potassium 09/29/2020 4.8  3.5 - 5.1 mmol/L Final   Chloride 09/29/2020 101  98 - 111 mmol/L Final   CO2 09/29/2020 27  22 - 32 mmol/L Final   Glucose, Bld 09/29/2020 203 (A) 70 - 99 mg/dL Final   Glucose reference range applies only to samples taken after fasting for at least 8 hours.   BUN 09/29/2020 22  8 - 23 mg/dL Final   Creatinine, Ser 09/29/2020 1.17  0.61 - 1.24 mg/dL Final   Calcium 09/29/2020 8.4 (A) 8.9 - 10.3 mg/dL Final   GFR, Estimated 09/29/2020 >60  >60 mL/min Final   Comment: (NOTE) Calculated using the CKD-EPI Creatinine Equation (2021)    Anion gap 09/29/2020 7  5 - 15 Final    Performed at [facility], [place] [address] [place], [place] [address]   Glucose-Capillary 09/28/2020 189 (A) 70 - 99 mg/dL Final   Glucose reference range applies only to samples taken after fasting for at least 8 hours.   Glucose-Capillary 09/29/2020 163 (A) 70 - 99 mg/dL Final   Glucose reference range applies only to samples taken after fasting for at least 8 hours.   Glucose-Capillary 09/29/2020 145 (A) 70 - 99 mg/dL Final   Glucose reference range applies only to samples taken after fasting for at least 8 hours.  Hospital Outpatient Visit on 09/24/2020  Component Date Value Ref Range Status   SARS Coronavirus 2 09/24/2020 NEGATIVE  NEGATIVE Final   Comment: (NOTE) SARS-CoV-2 target nucleic acids are NOT DETECTED.  The SARS-CoV-2 RNA is generally detectable in upper and lower respiratory specimens during the acute phase of infection. Negative results do not preclude SARS-CoV-2 infection, do not rule out co-infections with other pathogens, and should not be used as the sole basis for treatment or other patient management decisions. Negative results must be combined with clinical observations, patient history, and epidemiological information. The expected result is Negative.  Fact Sheet for Patients: SugarRoll.be  Fact Sheet for Healthcare Providers: https://www.woods-mathews.com/  This test is not yet approved or cleared by the [place] FDA and  has been authorized for detection and/or diagnosis of SARS-CoV-2 by FDA under an Emergency Use Authorization (EUA). This EUA will remain  in effect (meaning this test can be used) for the duration of the COVID-19 declaration under Se                          ction 564(b)(1) of the Act, 21 U.S.C. section 360bbb-3(b)(1), unless the authorization is terminated or revoked sooner.  Performed at [facility], [place] [address], [place], [place] [address]   Hospital  Outpatient Visit on 09/16/2020  Component Date Value Ref Range Status   MRSA, PCR 09/16/2020 NEGATIVE  NEGATIVE Final   Staphylococcus aureus 09/16/2020 POSITIVE (A) NEGATIVE Final   Comment: (NOTE) The Xpert SA Assay (FDA approved for NASAL specimens in patients 57 years of age and older), is one component of a comprehensive surveillance program. It is not intended to diagnose infection nor to guide or monitor treatment. Performed at [facility], [place] [address], [place], [place] [address]    Hgb A1c MFr Bld 09/16/2020 6.1 (A) 4.8 - 5.6 % Final   Comment: (NOTE) Pre diabetes:          5.7%-6.4%  Diabetes:              >  6.4%  Glycemic control for   <7.0% adults with diabetes    Mean Plasma Glucose 09/16/2020 128.37  mg/dL Final   Performed at [place] [address], [place], [place] [address]   WBC 09/16/2020 5.4  4.0 - 10.5 K/uL Final   RBC 09/16/2020 5.18  4.22 - 5.81 MIL/uL Final   Hemoglobin 09/16/2020 16.0  13.0 - 17.0 g/dL Final   HCT 09/16/2020 46.7  39.0 - 52.0 % Final   MCV 09/16/2020 90.2  80.0 - 100.0 fL Final   MCH 09/16/2020 30.9  26.0 - 34.0 pg Final   MCHC 09/16/2020 34.3  30.0 - 36.0 g/dL Final   RDW 09/16/2020 13.1  11.5 - 15.5 % Final   Platelets 09/16/2020 190  150 - 400 K/uL Final   nRBC 09/16/2020 0.0  0.0 - 0.2 % Final   Performed at [facility], [place] [address], [place], [place] [address]   Sodium 09/16/2020 140  135 - 145 mmol/L Final   Potassium 09/16/2020 4.4  3.5 - 5.1 mmol/L Final   Chloride 09/16/2020 103  98 - 111 mmol/L Final   CO2 09/16/2020 29  22 - 32 mmol/L Final   Glucose, Bld 09/16/2020 122 (A) 70 - 99 mg/dL Final   Glucose reference range applies only to samples taken after fasting for at least 8 hours.   BUN 09/16/2020 17  8 - 23 mg/dL Final   Creatinine, Ser 09/16/2020 1.12  0.61 - 1.24 mg/dL Final   Calcium 09/16/2020 9.8  8.9 - 10.3 mg/dL Final   Total Protein 09/16/2020 7.5  6.5 -  8.1 g/dL Final   Albumin 09/16/2020 4.7  3.5 - 5.0 g/dL Final   AST 09/16/2020 26  15 - 41 U/L Final   ALT 09/16/2020 32  0 - 44 U/L Final   Alkaline Phosphatase 09/16/2020 60  38 - 126 U/L Final   Total Bilirubin 09/16/2020 0.9  0.3 - 1.2 mg/dL Final   GFR, Estimated 09/16/2020 >60  >60 mL/min Final   Comment: (NOTE) Calculated using the CKD-EPI Creatinine Equation (2021)    Anion gap 09/16/2020 8  5 - 15 Final   Performed at [facility], [place] [address], [place], [place] [address]   ABO/RH(D) 09/16/2020 O NEG   Final   Antibody Screen 09/16/2020 NEG   Final   Sample Expiration 09/16/2020 09/30/2020,2359   Final   Extend sample reason 09/16/2020    Final                   Value:NO TRANSFUSIONS OR PREGNANCY IN THE PAST 3 MONTHS Performed at [place] [address] [place], [place] [address]    Glucose-Capillary 09/16/2020 129 (A) 70 - 99 mg/dL Final   Glucose reference range applies only to samples taken after fasting for at least 8 hours.     X-Rays:DG Pelvis Portable  Result Date: 09/28/2020 CLINICAL DATA:  Status post right total hip arthroplasty EXAM: PORTABLE PELVIS 1-2 VIEWS COMPARISON:  None. FINDINGS: Status post right total hip arthroplasty with well-positioned intact hardware. No evidence of hip dislocation on this single frontal view. The fracture. No focal osseous lesions. Expected soft tissue gas surrounding the right hip joint. Brachytherapy seeds overlie the region of the prostate. Mild-to-moderate left hip osteoarthritis. IMPRESSION: Satisfactory immediate postoperative single frontal view appearance status post right total hip arthroplasty. Electronically Signed   By: Ilona Sorrel M.D.   On: 09/28/2020 13:36   DG  C-Arm 1-60 Min-No Report  Result Date: 09/28/2020 Fluoroscopy was utilized by the requesting physician.  No radiographic interpretation.   DG HIP OPERATIVE UNILAT W OR W/O PELVIS RIGHT  Result Date:  09/28/2020 CLINICAL DATA:  Right anterior hip replacement EXAM: OPERATIVE RIGHT HIP (WITH PELVIS IF PERFORMED) 2 VIEWS TECHNIQUE: Fluoroscopic spot image(s) were submitted for interpretation post-operatively. FLUOROSCOPY TIME:  11 seconds COMPARISON:  CT abdomen and pelvis-06/12/2018 FINDINGS: Two spot anterior projection fluoroscopic images of the right hip and lower pelvis are provided for review and demonstrate the sequela of ongoing right total hip replacement. Alignment appears anatomic given AP projection. Brachytherapy seeds overlies expected location of the prostate bed. Mild-to-moderate degenerative changes suspected within the contralateral left hip, incompletely evaluated. IMPRESSION: Ongoing right total hip replacement without evidence of complication. Electronically Signed   By: Sandi Mariscal M.D.   On: 09/28/2020 10:40    EKG: Orders placed or performed during the hospital encounter of 09/16/20   EKG 12 lead per protocol   EKG 12 lead per protocol     Hospital Course: [name] is a 70 y.o. who was admitted to [facility]. They were brought to the operating room on 09/28/2020 and underwent Procedure(s): [place].  Patient tolerated the procedure well and was later transferred to the recovery room and then to the orthopaedic floor for postoperative care. They were given PO and IV analgesics for pain control following their surgery. They were given 24 hours of postoperative antibiotics of  Anti-infectives (From admission, onward)    Start     Dose/Rate Route Frequency Ordered Stop   09/28/20 1500  ceFAZolin (ANCEF) IVPB 2g/100 mL premix        2 g 200 mL/hr over 30 Minutes Intravenous Every 6 hours 09/28/20 1234 09/28/20 2139   09/28/20 0645  ceFAZolin (ANCEF) IVPB 2g/100 mL premix        2 g 200 mL/hr over 30 Minutes Intravenous On call to O.R. 09/28/20 [id number] 09/28/20 0906      and started on DVT prophylaxis in the form of Aspirin.   PT  and OT were ordered for total joint protocol. Discharge planning consulted to help with postop disposition and equipment needs.  Patient had a good night on the evening of surgery. They started to get up OOB with therapy on POD #0. Pt was seen during rounds and was ready to go home pending progress with therapy. He worked with therapy on POD #1 and was meeting his goals. Pt was discharged to home later that day in stable condition.  Diet: Regular diet Activity: WBAT Follow-up: in 2 weeks Disposition: Home Discharged Condition: good   Discharge Instructions     Call MD / Call 911   Complete by: As directed    If you experience chest pain or shortness of breath, CALL 911 and be transported to the hospital emergency room.  If you develope a fever above 101 F, pus (white drainage) or increased drainage or redness at the wound, or calf pain, call your surgeon's office.   Change dressing   Complete by: As directed    Maintain surgical dressing until follow up in the clinic. If the edges start to pull up, may reinforce with tape. If the dressing is no longer working, may remove and cover with gauze and tape, but must keep the area dry and clean.  Call with any questions or concerns.   Constipation Prevention   Complete by: As directed  Drink plenty of fluids.  Prune juice may be helpful.  You may use a stool softener, such as Colace (over the counter) 100 mg twice a day.  Use MiraLax (over the counter) for constipation as needed.   Diet - low sodium heart healthy   Complete by: As directed    Increase activity slowly as tolerated   Complete by: As directed    Weight bearing as tolerated with assist device (walker, cane, etc) as directed, use it as long as suggested by your surgeon or therapist, typically at least 4-6 weeks.   Post-operative opioid taper instructions:   Complete by: As directed    POST-OPERATIVE OPIOID TAPER INSTRUCTIONS: It is important to wean off of your opioid medication as  soon as possible. If you do not need pain medication after your surgery it is ok to stop day one. Opioids include: Codeine, Hydrocodone(Norco, Vicodin), Oxycodone(Percocet, oxycontin) and hydromorphone amongst others.  Long term and even short term use of opiods can cause: Increased pain response Dependence Constipation Depression Respiratory depression And more.  Withdrawal symptoms can include Flu like symptoms Nausea, vomiting And more Techniques to manage these symptoms Hydrate well Eat regular healthy meals Stay active Use relaxation techniques(deep breathing, meditating, yoga) Do Not substitute Alcohol to help with tapering If you have been on opioids for less than two weeks and do not have pain than it is ok to stop all together.  Plan to wean off of opioids This plan should start within one week post op of your joint replacement. Maintain the same interval or time between taking each dose and first decrease the dose.  Cut the total daily intake of opioids by one tablet each day Next start to increase the time between doses. The last dose that should be eliminated is the evening dose.      TED hose   Complete by: As directed    Use stockings (TED hose) for 2 weeks on both leg(s).  You may remove them at night for sleeping.      Allergies as of 09/29/2020   No Known Allergies      Medication List     STOP taking these medications    acetaminophen 650 MG CR tablet Commonly known as: TYLENOL   aspirin EC 81 MG tablet Replaced by: aspirin 81 MG chewable tablet   Fish Oil 1000 MG Caps   meloxicam 15 MG tablet Commonly known as: MOBIC       TAKE these medications    aspirin 81 MG chewable tablet Chew 1 tablet (81 mg total) by mouth 2 (two) times daily for 28 days. Replaces: aspirin EC 81 MG tablet   celecoxib 200 MG capsule Commonly known as: CELEBREX Take 1 capsule (200 mg total) by mouth 2 (two) times daily.   CINNAMON PO Take 1,000 mg by mouth  in the morning.   docusate sodium 100 MG capsule Commonly known as: COLACE Take 1 capsule (100 mg total) by mouth 2 (two) times daily. Notes to patient: Stool softener   glimepiride 1 MG tablet Commonly known as: AMARYL Take 1 mg by mouth in the morning.   HYDROcodone-acetaminophen 5-325 MG tablet Commonly known as: NORCO/VICODIN Take 1-2 tablets by mouth every 4 (four) hours as needed for severe pain.   lisinopril 10 MG tablet Commonly known as: ZESTRIL Take 10 mg by mouth in the morning.   metFORMIN 500 MG 24 hr tablet Commonly known as: GLUCOPHAGE-XR Take 2,000 mg by mouth daily with breakfast.  methocarbamol 500 MG tablet Commonly known as: ROBAXIN Take 1 tablet (500 mg total) by mouth every 6 (six) hours as needed for muscle spasms.   multivitamin with minerals tablet Take 1 tablet by mouth in the morning.   polyethylene glycol 17 g packet Commonly known as: MIRALAX / GLYCOLAX Take 17 g by mouth daily as needed for mild constipation.   pravastatin 80 MG tablet Commonly known as: PRAVACHOL Take 80 mg by mouth in the morning.               Discharge Care Instructions  (From admission, onward)           Start     Ordered   09/29/20 0000  Change dressing       Comments: Maintain surgical dressing until follow up in the clinic. If the edges start to pull up, may reinforce with tape. If the dressing is no longer working, may remove and cover with gauze and tape, but must keep the area dry and clean.  Call with any questions or concerns.   09/29/20 [id number]            Follow-up Information     Paralee Cancel, MD. Schedule an appointment as soon as possible for a visit in 2 week(s).   Specialty: Orthopedic Surgery Contact information: [address] [place] [address] [phone]                 Signed: Griffith Citron, PA-C Orthopedic Surgery 10/04/2020, 1:52 PM

## 2020-10-05 ENCOUNTER — Encounter (HOSPITAL_COMMUNITY): Payer: Self-pay | Admitting: Orthopedic Surgery

## 2020-10-05 NOTE — Anesthesia Postprocedure Evaluation (Signed)
Anesthesia Post Note  Patient: LUIZ TRUMPOWER  Procedure(s) Performed: TOTAL HIP ARTHROPLASTY ANTERIOR APPROACH (Right: Hip)     Patient location during evaluation: PACU Anesthesia Type: MAC and Spinal Level of consciousness: awake and alert Pain management: pain level controlled Vital Signs Assessment: post-procedure vital signs reviewed and stable Respiratory status: spontaneous breathing, nonlabored ventilation, respiratory function stable and patient connected to nasal cannula oxygen Cardiovascular status: stable and blood pressure returned to baseline Postop Assessment: no apparent nausea or vomiting Anesthetic complications: no   No notable events documented.  Last Vitals:  Vitals:   09/29/20 0459 09/29/20 0929  BP: 116/67 (!) 117/59  Pulse: 79 72  Resp: 17 17  Temp: 36.7 C 36.6 C  SpO2: 97% 97%    Last Pain:  Vitals:   09/29/20 1132  TempSrc:   PainSc: 0-No pain                 Yeng Frankie

## 2021-08-23 IMAGING — XA DG HIP (WITH PELVIS) OPERATIVE*R*
1 series · 2 of 2 positions shown · non-contrast
Comparison: CT abdomen and pelvis-06/12/2018

CLINICAL DATA: Right anterior hip replacement

EXAM:
OPERATIVE RIGHT HIP (WITH PELVIS IF PERFORMED) 2 VIEWS
TECHNIQUE: Fluoroscopic spot image(s) were submitted for interpretation
post-operatively.
FLUOROSCOPY TIME:  11 seconds

[Series 1: unknown protocol · 2 of 2 slices shown]
[im 1/2]
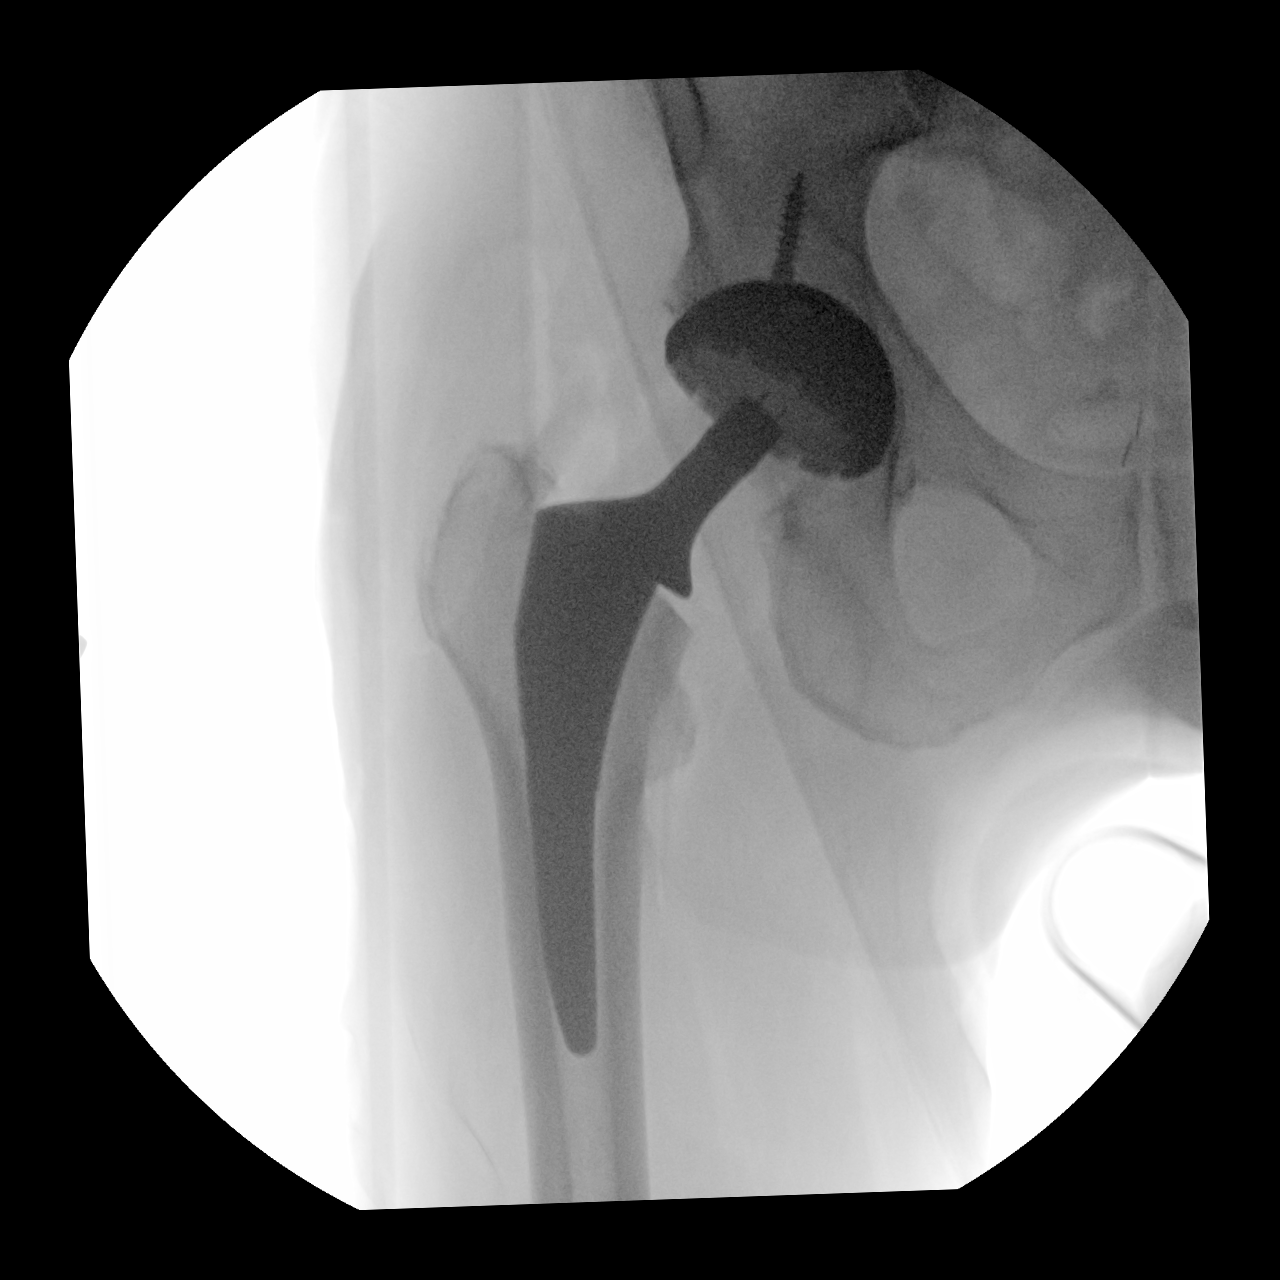
[im 2/2]
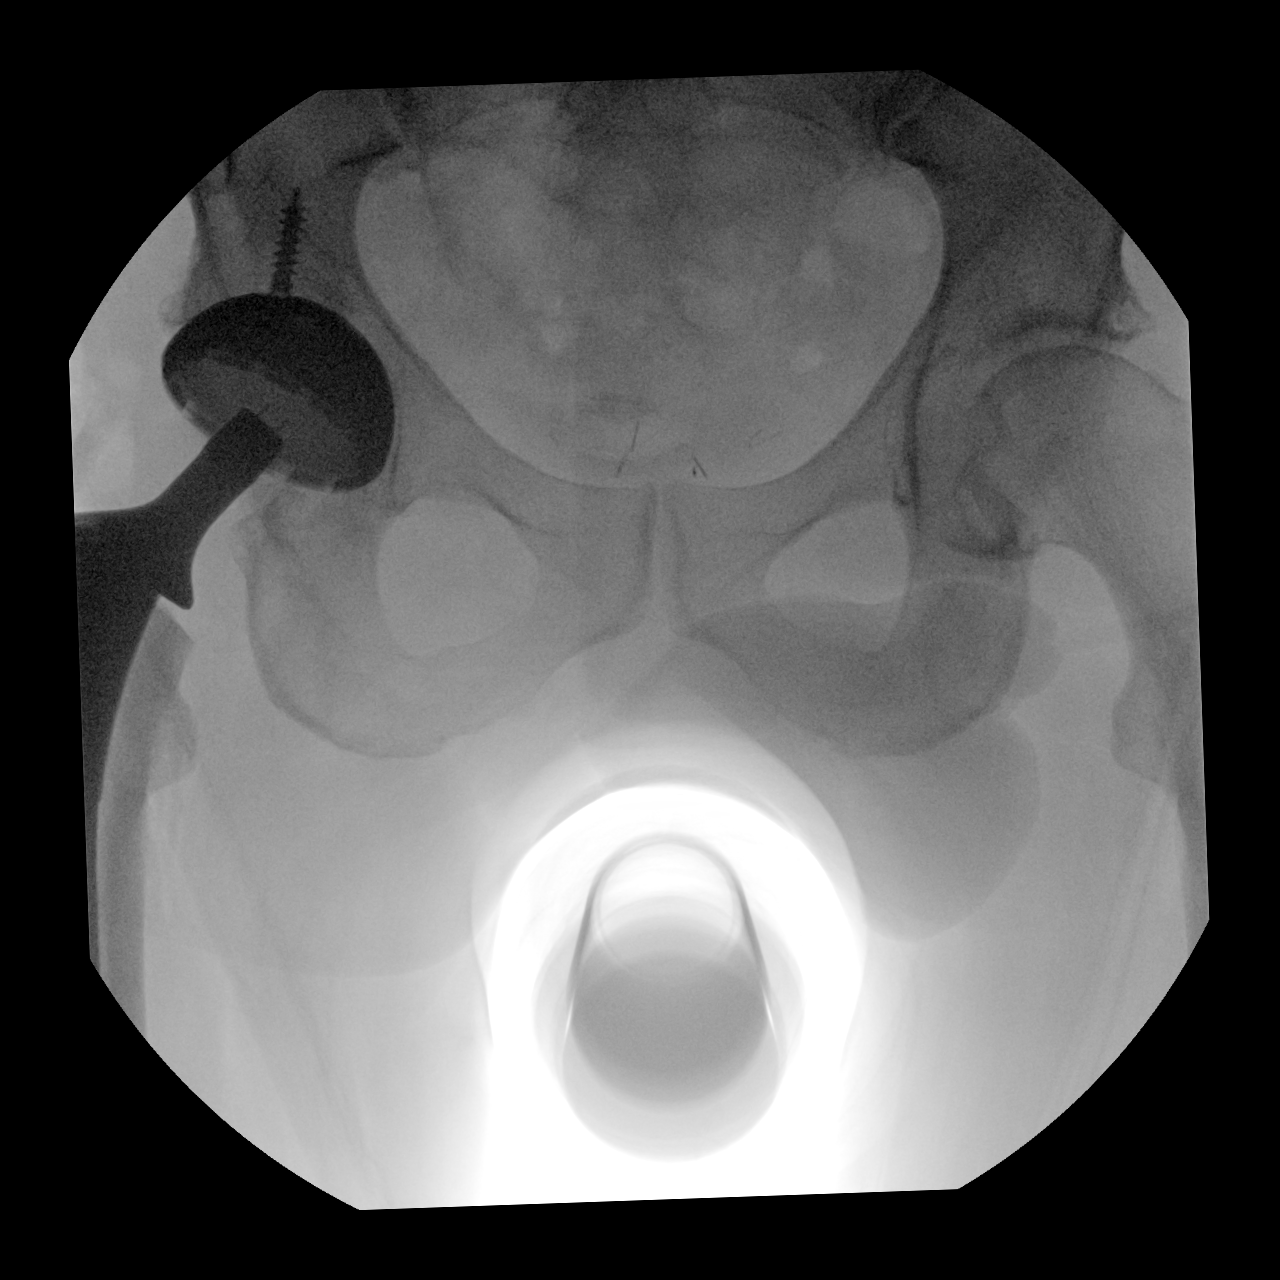

[2 of 2 positions shown; findings below may reference images not displayed]

FINDINGS: Two spot anterior projection fluoroscopic images of the right hip
and lower pelvis are provided for review and demonstrate the sequela
of ongoing right total hip replacement. Alignment appears anatomic
given AP projection.

Brachytherapy seeds overlies expected location of the prostate bed.
Mild-to-moderate degenerative changes suspected within the
contralateral left hip, incompletely evaluated.
IMPRESSION: Ongoing right total hip replacement without evidence of
complication.

## 2022-11-09 ENCOUNTER — Encounter: Payer: Self-pay | Admitting: Urology

## 2022-11-09 ENCOUNTER — Ambulatory Visit: Payer: Medicare Other | Admitting: Urology

## 2022-11-09 VITALS — BP 151/71 | HR 77 | Ht 68.9 in | Wt 180.0 lb

## 2022-11-09 DIAGNOSIS — N401 Enlarged prostate with lower urinary tract symptoms: Secondary | ICD-10-CM

## 2022-11-09 DIAGNOSIS — Z125 Encounter for screening for malignant neoplasm of prostate: Secondary | ICD-10-CM

## 2022-11-09 DIAGNOSIS — N138 Other obstructive and reflux uropathy: Secondary | ICD-10-CM

## 2022-11-09 DIAGNOSIS — N529 Male erectile dysfunction, unspecified: Secondary | ICD-10-CM

## 2022-11-09 MED ORDER — TAMSULOSIN HCL 0.4 MG PO CAPS: 0.4000 mg | ORAL_CAPSULE | Freq: Every day | ORAL | 3 refills | Status: DC

## 2022-11-09 NOTE — Progress Notes (Signed)
11/09/22 3:06 PM   [name] 1950/07/02 [id number]  CC: History of bladder cancer, BPH, PSA screening, ED  HPI: 72 year old male transferring his care from Dr. Sheppard Penton.  He has a history of low-grade nonmuscle invasive superficial bladder cancer diagnosed in 2018, as well as history of BPH treated with UroLift in 2019.  No recurrence is found on follow-up for his bladder cancer, discontinued yearly cystoscopy after 5 years per the guideline recommendations.  He denies any gross hematuria.  He has some persistent weak stream, urgency and frequency despite UroLift in 2019, remains on maximal medical therapy with dutasteride-tamsulosin.  He would like to get off the dutasteride since he was told he cannot donate blood on that medication.  We discussed avoiding bladder irritants like artificial sweeteners and improved control of diabetes.  He is not bothered enough to consider an OAB medication or cystoscopy at this time.  He also has ED, not interested in medications at this time.  PSA has been normal, most recently 1.0 from October 2023, corrected for finasteride 2.0.  PMH: Past Medical History:  Diagnosis Date   Arthritis    Bladder cancer ([facility])    Cancer ([facility]) 2007   basal cell ca on nose....had Mohs done   Diabetes mellitus without complication ([facility]CC)    Diabetic peripheral neuropathy ([facility])    Fatty liver disease, nonalcoholic    Foot drop, right foot    Hyperlipidemia    Hypertension    Lumbar stenosis    Post-operative nausea and vomiting    Post-operative nausea and vomiting    one time   Wears glasses     Surgical History: Past Surgical History:  Procedure Laterality Date   BASAL CELL CARCINOMA EXCISION     on nose   CARPAL TUNNEL RELEASE Bilateral    CHOLECYSTECTOMY  2004   lap choli   COLONOSCOPY     GASTROC RECESSION EXTREMITY Right 11/20/2013   Procedure: GASTROC RECESSION EXTREMITY;  Surgeon: Toni Arthurs, MD;  Location: [facility];   Service: Orthopedics;  Laterality: Right;   HERNIA REPAIR  2009   umb   KNEE ARTHROSCOPY Left    SHOULDER ARTHROSCOPY  2012   right   TENDON REPAIR Right 11/20/2013   Procedure: RIGHT TIBIALIS ANTERIOR RECONSTRUCTION GASTROC RECESSION ;  Surgeon: Toni Arthurs, MD;  Location: [facility];  Service: Orthopedics;  Laterality: Right;   TOTAL HIP ARTHROPLASTY Right 09/28/2020   Procedure: TOTAL HIP ARTHROPLASTY ANTERIOR APPROACH;  Surgeon: Durene Romans, MD;  Location: [facility];  Service: Orthopedics;  Laterality: Right;   TRANSURETHRAL RESECTION OF BLADDER TUMOR N/A 10/31/2016   Procedure: TRANSURETHRAL RESECTION OF BLADDER TUMOR (TURBT);  Surgeon: Orson Ape, MD;  Location: [facility];  Service: Urology;  Laterality: N/A;    Family History: Family History  Problem Relation Age of Onset   Diabetes Mother    Alzheimer's disease Mother    Diabetes Brother     Social History:  reports that he has never smoked. He has never used smokeless tobacco. He reports that he does not drink alcohol and does not use drugs.  Physical Exam: BP (!) 151/71 (BP Location: Left Arm, Patient Position: Sitting, Cuff Size: Normal)   Pulse 77   Ht 5' 8.9" (1.75 m)   Wt 180 lb (81.6 kg)   BMI 26.66 kg/m    Constitutional:  Alert and oriented, No acute distress. Cardiovascular: No clubbing, cyanosis, or edema. Respiratory: Normal respiratory effort, no increased work of  breathing. GI: Abdomen is soft, nontender, nondistended, no abdominal masses   Assessment & Plan:   72 year old male previously followed by Dr. Sheppard Penton with history of low-grade nonmuscle invasive superficial bladder cancer in 2018 as well as UroLift in 2019.  No further surveillance needed for history of low-grade bladder cancer per the guideline recommendations.  He would like to change to Flomax alone for his urinary symptoms.  We reviewed AUA guidelines that do not recommend routine PSA screening in men over age 17, he is  comfortable discontinuing screening at this time.    BPH medication changed to Flomax alone Discussed avoiding artificial sweeteners and bladder irritants regarding urinary symptoms RTC 1 year PVR  Legrand Rams, MD 11/09/2022  [facility] [address] [place], [place] [address] [phone]

## 2023-08-23 ENCOUNTER — Encounter: Payer: Self-pay | Admitting: Urology

## 2023-10-31 ENCOUNTER — Ambulatory Visit (INDEPENDENT_AMBULATORY_CARE_PROVIDER_SITE_OTHER): Payer: PRIVATE HEALTH INSURANCE | Admitting: Urology

## 2023-10-31 VITALS — BP 156/82 | HR 76 | Ht 68.9 in | Wt 185.0 lb

## 2023-10-31 DIAGNOSIS — Z125 Encounter for screening for malignant neoplasm of prostate: Secondary | ICD-10-CM

## 2023-10-31 DIAGNOSIS — N138 Other obstructive and reflux uropathy: Secondary | ICD-10-CM | POA: Diagnosis not present

## 2023-10-31 DIAGNOSIS — N529 Male erectile dysfunction, unspecified: Secondary | ICD-10-CM | POA: Diagnosis not present

## 2023-10-31 DIAGNOSIS — N401 Enlarged prostate with lower urinary tract symptoms: Secondary | ICD-10-CM | POA: Diagnosis not present

## 2023-10-31 LAB — MICROSCOPIC EXAMINATION

## 2023-10-31 LAB — BLADDER SCAN AMB NON-IMAGING

## 2023-10-31 MED ORDER — TAMSULOSIN HCL 0.4 MG PO CAPS: 0.4000 mg | ORAL_CAPSULE | Freq: Every day | ORAL | 3 refills | Status: AC

## 2023-10-31 NOTE — Progress Notes (Signed)
   10/31/2023 12:40 PM   [name] [birth year] [id number]  Reason for visit: Follow up BPH/LUTS, PSA screening, ED, history of bladder cancer  History: Previously followed by Dr. 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, not interested in injections History of low risk bladder cancer diagnosed in 2018, discontinued routine surveillance after 5 years per guideline recommendations  Physical Exam: BP (!) 156/82 (BP Location: Left Arm, Patient Position: Sitting, Cuff Size: Normal)   Pulse 76   Ht 5' 8.9 (1.75 m)   Wt 185 lb (83.9 kg)   SpO2 97%   BMI 27.40 kg/m   Imaging/labs: Urinalysis today completely benign PSA November 2024 normal at 0.91  Today: No major changes over the last year Denies any gross hematuria or dysuria Persisting ED, not interested in penile injections Urinary symptoms stable on Flomax  alone(previously was on dutasteride and discontinued so he could donate blood) Primary urinary complaint is frequency/urgency.  IPSS score today 7, quality-of-life mixed, PVR normal at 69ml  Plan:   BPH/urinary symptoms: Continue Flomax , consider cystoscopy/TRUS in the future if worsening symptoms.  We discussed he could increase the Flomax  dose to 0.8 mg nightly and he will consider this ED: Refractory to PDE 5 inhibitors, declines further treatment History of low-grade bladder cancer: 2018, completed 5-year surveillance, continue yearly urinalysis PSA screening: PSA normal at 0.91 November 2024, with his excellent health can continue screening through age 6 Follow-up 1 year urinalysis, PVR   Joseph JAYSON Burnet, MD  [facility] [address] [place], [place] [address] [phone]

## 2023-10-31 NOTE — Patient Instructions (Signed)
 Understanding BPH (Benign Prostatic Hyperplasia)  What is BPH? BPH stands for Benign Prostatic Hyperplasia. It is a common condition that affects many men as they get older. It happens when the prostate gland, which is part of the male reproductive system, gets bigger. This can cause problems when urinating.  What Are the Symptoms?    Feeling like you need to urinate often, especially at night Trouble starting urinating or weak stream Sudden urge to urinate Feeling that your bladder has not emptied completely  Why Does It Happen? As men age, the prostate naturally gets bigger. This can press against the tube that carries urine out of the bladder, making it hard to urinate.  How Is BPH Treated? There are different ways to treat BPH, depending on how severe the symptoms are:  Lifestyle Changes    Reduce drinks before bedtime Avoid diet/flavored drinks, caffeine, and alcohol Practice double voiding (urinating twice during a trip to the bathroom)  Medications    Alpha-blockers (like tamsulosin /flomax ) to relax the muscles in the prostate and bladder neck 5-alpha reductase inhibitors (like finasteride) to shrink the prostate  If your symptoms do not respond to medications, you have side effect from medications, or would just like to get off of medications, there are multiple surgical options to improve urination.  Procedures and Surgery(Urolift and HoLEP)    UroLift: ~15-minute outpatient procedure performed in the operating room where the prostate is pinned open with small clips.  Patient's discharge the same day, rarely needed catheter, no risk of urinary leakage or erection problems.  Very low risk of bleeding or infection.  It is common to have some temporary irritative symptoms for 1 to 2 weeks after surgery including burning with urination, urgency, frequency.  This is a good option for small prostate or patients with mild to moderate symptoms, but may not be the best long-term  treatment if you have a very large prostate, have urinary retention requiring a catheter, or have severe symptoms  HoLEP(holmium laser enucleation of the prostate): ~1 hour outpatient procedure performed in the operating room where a laser is used to hollow out a large channel through the prostate.  The procedure was performed through the urethra, so there are no cuts or incisions.  You will need a catheter for 2 days afterwards.  Low risk of bleeding, infection, or damage to the bladder or ureters.  This is the gold standard BPH procedure.  Common to have urinary urgency, frequency, and leakage temporarily afterwards, but <2% risk of long-term incontinence.  This is the best treatment if you have a very large prostate, retention requiring a Foley catheter, or have severe symptoms.

## 2023-11-01 LAB — URINALYSIS, COMPLETE
Bilirubin, UA: NEGATIVE
Glucose, UA: NEGATIVE
Ketones, UA: NEGATIVE
Leukocytes,UA: NEGATIVE
Nitrite, UA: NEGATIVE
Protein,UA: NEGATIVE
RBC, UA: NEGATIVE
Specific Gravity, UA: 1.02 (ref 1.005–1.030)
Urobilinogen, Ur: 0.2 mg/dL (ref 0.2–1.0)
pH, UA: 6 (ref 5.0–7.5)

## 2023-11-01 LAB — MICROSCOPIC EXAMINATION: Renal Epithel, UA: NONE SEEN

## 2023-11-07 ENCOUNTER — Ambulatory Visit: Payer: PRIVATE HEALTH INSURANCE | Admitting: Urology

## 2023-11-08 ENCOUNTER — Ambulatory Visit: Payer: Self-pay | Admitting: Urology

## 2024-02-20 ENCOUNTER — Ambulatory Visit: Payer: PRIVATE HEALTH INSURANCE | Admitting: Podiatry

## 2024-02-20 ENCOUNTER — Ambulatory Visit

## 2024-02-20 DIAGNOSIS — M722 Plantar fascial fibromatosis: Secondary | ICD-10-CM

## 2024-02-20 MED ORDER — MELOXICAM 15 MG PO TABS: 15.0000 mg | ORAL_TABLET | Freq: Every day | ORAL | 0 refills | Status: AC

## 2024-02-20 MED ORDER — METHYLPREDNISOLONE 4 MG PO TBPK: ORAL_TABLET | ORAL | 0 refills | Status: AC

## 2024-02-20 MED ORDER — TRIAMCINOLONE ACETONIDE 40 MG/ML IJ SUSP
20.0000 mg | Freq: Once | INTRAMUSCULAR | Status: AC
  Administered 2024-02-20: 20 mg

## 2024-02-20 NOTE — Progress Notes (Signed)
 "  Subjective:  Patient ID: [name], male    DOB: 07/24/1950,  MRN: [id number] HPI Chief Complaint  Patient presents with   Foot Pain      Left foot Heel pain     74 y.o. male presents with the above complaint.   ROS: Denies fever chills nausea vomiting muscle aches pains calf pain back pain chest pain shortness of breath.  Past Medical History:  Diagnosis Date   Arthritis    Bladder cancer (HCC)    Cancer (HCC) 2007   basal cell ca on nose....had Mohs done   Diabetes mellitus without complication (HCC)    Diabetic peripheral neuropathy (HCC)    Fatty liver disease, nonalcoholic    Foot drop, right foot    Hyperlipidemia    Hypertension    Lumbar stenosis    Post-operative nausea and vomiting    Post-operative nausea and vomiting    one time   Wears glasses    Past Surgical History:  Procedure Laterality Date   BASAL CELL CARCINOMA EXCISION     on nose   CARPAL TUNNEL RELEASE Bilateral    CHOLECYSTECTOMY  2004   lap choli   COLONOSCOPY     GASTROC RECESSION EXTREMITY Right 11/20/2013   Procedure: GASTROC RECESSION EXTREMITY;  Surgeon: Norleen Armor, MD;  Location: [facility];  Service: Orthopedics;  Laterality: Right;   HERNIA REPAIR  2009   umb   KNEE ARTHROSCOPY Left    SHOULDER ARTHROSCOPY  2012   right   TENDON REPAIR Right 11/20/2013   Procedure: RIGHT TIBIALIS ANTERIOR RECONSTRUCTION GASTROC RECESSION ;  Surgeon: Norleen Armor, MD;  Location: [facility];  Service: Orthopedics;  Laterality: Right;   TOTAL HIP ARTHROPLASTY Right 09/28/2020   Procedure: TOTAL HIP ARTHROPLASTY ANTERIOR APPROACH;  Surgeon: Ernie Cough, MD;  Location: [facility];  Service: Orthopedics;  Laterality: Right;   TRANSURETHRAL RESECTION OF BLADDER TUMOR N/A 10/31/2016   Procedure: TRANSURETHRAL RESECTION OF BLADDER TUMOR (TURBT);  Surgeon: Kassie Ozell SAUNDERS, MD;  Location: [facility];  Service: Urology;  Laterality: N/A;   Current  Medications[1]  Allergies[2] Review of Systems Objective:  There were no vitals filed for this visit.  General: Well developed, nourished, in no acute distress, alert and oriented x3   Dermatological: Skin is warm, dry and supple bilateral. Nails x 10 are well maintained; remaining integument appears unremarkable at this time. There are no open sores, no preulcerative lesions, no rash or signs of infection present.  Vascular: Dorsalis Pedis artery and Posterior Tibial artery pedal pulses are 2/4 bilateral with immedate capillary fill time. Pedal hair growth present. No varicosities and no lower extremity edema present bilateral.   Neruologic: Grossly intact via light touch bilateral. Vibratory intact via tuning fork bilateral. Protective threshold with Semmes Wienstein monofilament intact to all pedal sites bilateral. Patellar and Achilles deep tendon reflexes 2+ bilateral. No Babinski or clonus noted bilateral.   Musculoskeletal: No gross boney pedal deformities bilateral. No pain, crepitus, or limitation noted with foot and ankle range of motion bilateral. Muscular strength 5/5 in all groups tested bilateral.  Pain on palpation MucoClear tubercle of the left heel.  No pain on medial-lateral compression of the calcaneus.  Gait: Unassisted, Nonantalgic.    Radiographs:  Radiographs taken today demonstrate osseously mature individual soft tissue increase in density plantar fascial canny insertion site of the left heel.  No acute findings identified.  Assessment & Plan:   Assessment: Plantar fasciitis left heel.  Plan: Discussed etiology pathology conservative versus surgical therapies.  Injected his left heel today 20 mg Kenalog  pentagrams Marcaine  at the point of maximal tenderness.  I also started him on methylprednisolone  to be followed by meloxicam .  Discussed appropriate shoe gear stretching exercise ice therapy and shoe gear modifications.  Follow-up with him in 1 month     Zyier Dykema  T. Sadee Osland, DPM    [1]  Current Outpatient Medications:    meloxicam  (MOBIC ) 15 MG tablet, Take 1 tablet (15 mg total) by mouth daily., Disp: 30 tablet, Rfl: 0   methylPREDNISolone  (MEDROL  DOSEPAK) 4 MG TBPK tablet, Take 6 tabs the 1st day, take 5 tabs the 2nd day, take 4 tabs the 3rd day, take 3 tabs the 4th day, take 2 tabs the 5th day, and take 1 tab the 6th day, Disp: 21 tablet, Rfl: 0   CINNAMON PO, Take 1,000 mg by mouth in the morning., Disp: , Rfl:    diclofenac (VOLTAREN) 75 MG EC tablet, Take 75 mg by mouth 2 (two) times daily with a meal., Disp: , Rfl:    docusate sodium  (COLACE) 100 MG capsule, Take 1 capsule (100 mg total) by mouth 2 (two) times daily., Disp: 10 capsule, Rfl: 0   glimepiride  (AMARYL ) 1 MG tablet, Take 1 mg by mouth in the morning., Disp: , Rfl:    glucose blood (ACCU-CHEK AVIVA PLUS) test strip, CHECK BLOOD SUGAR ONCE DAILY, Disp: , Rfl:    lisinopril  (PRINIVIL ,ZESTRIL ) 10 MG tablet, Take 10 mg by mouth in the morning., Disp: , Rfl:    losartan (COZAAR) 100 MG tablet, Take 100 mg by mouth daily., Disp: , Rfl:    metFORMIN  (GLUCOPHAGE -XR) 500 MG 24 hr tablet, Take 2,000 mg by mouth daily with breakfast., Disp: , Rfl:    methocarbamol  (ROBAXIN ) 500 MG tablet, Take 1 tablet (500 mg total) by mouth every 6 (six) hours as needed for muscle spasms., Disp: 40 tablet, Rfl: 0   Multiple Vitamins-Minerals (MULTIVITAMIN WITH MINERALS) tablet, Take 1 tablet by mouth in the morning., Disp: , Rfl:    polyethylene glycol (MIRALAX  / GLYCOLAX ) 17 g packet, Take 17 g by mouth daily as needed for mild constipation., Disp: 14 each, Rfl: 0   pravastatin  (PRAVACHOL ) 80 MG tablet, Take 80 mg by mouth in the morning., Disp: , Rfl:    Red Yeast Rice 600 MG CAPS, , Disp: , Rfl:    rosuvastatin (CRESTOR) 5 MG tablet, Take 5 mg by mouth daily., Disp: , Rfl:    tamsulosin  (FLOMAX ) 0.4 MG CAPS capsule, Take 1 capsule (0.4 mg total) by mouth daily after supper., Disp: 90 capsule, Rfl: 3 [2] No  Known Allergies  "

## 2024-04-02 ENCOUNTER — Ambulatory Visit: Admitting: Podiatry

## 2024-11-05 ENCOUNTER — Ambulatory Visit: Payer: PRIVATE HEALTH INSURANCE | Admitting: Urology
